# Patient Record
Sex: Male | Born: 1950 | Race: Asian | Hispanic: No | Marital: Married | Smoking: Former smoker
Health system: Southern US, Community
[De-identification: ages and names within clinical notes are randomized; demographics above are authoritative.]

## PROBLEM LIST (undated history)

## (undated) DIAGNOSIS — K5900 Constipation, unspecified: Secondary | ICD-10-CM

## (undated) DIAGNOSIS — K219 Gastro-esophageal reflux disease without esophagitis: Secondary | ICD-10-CM

## (undated) HISTORY — DX: Gastro-esophageal reflux disease without esophagitis: K21.9

## (undated) HISTORY — PX: HAND SURGERY: SHX662

## (undated) HISTORY — DX: Constipation, unspecified: K59.00

---

## 2011-10-29 ENCOUNTER — Ambulatory Visit: Payer: Self-pay | Admitting: Internal Medicine

## 2014-11-10 ENCOUNTER — Telehealth: Payer: Self-pay | Admitting: Family

## 2014-11-10 ENCOUNTER — Ambulatory Visit (INDEPENDENT_AMBULATORY_CARE_PROVIDER_SITE_OTHER): Payer: Medicare Other | Admitting: Family

## 2014-11-10 ENCOUNTER — Other Ambulatory Visit (INDEPENDENT_AMBULATORY_CARE_PROVIDER_SITE_OTHER): Payer: Medicare Other

## 2014-11-10 ENCOUNTER — Encounter: Payer: Self-pay | Admitting: Family

## 2014-11-10 VITALS — BP 118/82 | HR 55 | Temp 97.7°F | Resp 18 | Ht 64.0 in | Wt 138.0 lb

## 2014-11-10 DIAGNOSIS — Z720 Tobacco use: Secondary | ICD-10-CM | POA: Insufficient documentation

## 2014-11-10 DIAGNOSIS — R1013 Epigastric pain: Secondary | ICD-10-CM | POA: Diagnosis not present

## 2014-11-10 DIAGNOSIS — K5900 Constipation, unspecified: Secondary | ICD-10-CM

## 2014-11-10 DIAGNOSIS — K5909 Other constipation: Secondary | ICD-10-CM | POA: Insufficient documentation

## 2014-11-10 LAB — AMYLASE: Amylase: 59 U/L (ref 27–131)

## 2014-11-10 LAB — LIPASE: Lipase: 26 U/L (ref 11.0–59.0)

## 2014-11-10 LAB — H. PYLORI ANTIBODY, IGG: H PYLORI IGG: NEGATIVE

## 2014-11-10 MED ORDER — OMEPRAZOLE 20 MG PO CPDR: 20.0000 mg | DELAYED_RELEASE_CAPSULE | Freq: Every day | ORAL | Status: DC

## 2014-11-10 NOTE — Assessment & Plan Note (Signed)
Current smoker with approximately 20 year pack history. Not currently ready to quit at this time.

## 2014-11-10 NOTE — Patient Instructions (Signed)
Thank you for choosing [facility].  Summary/Instructions:   Please start over the counter colace (docusate sodium) daily for stool softener  May also add Miralax or Senna as needed.   Your prescription(s) have been submitted to your pharmacy or been printed and provided for you. Please take as directed and contact our office if you believe you are having problem(s) with the medication(s) or have any questions.  Please stop by the lab on the basement level of the building for your blood work. Your results will be released to [facility] (or called to you) after review, usually within 72 hours after test completion. If any changes need to be made, you will be notified at that same time.  If your symptoms worsen or fail to improve, please contact our office for further instruction, or in case of emergency go directly to the emergency room at the closest medical facility.    Constipation Constipation is when a person has fewer than three bowel movements a week, has difficulty having a bowel movement, or has stools that are dry, hard, or larger than normal. As people grow older, constipation is more common. If you try to fix constipation with medicines that make you have a bowel movement (laxatives), the problem may get worse. Long-term laxative use may cause the muscles of the colon to become weak. A low-fiber diet, not taking in enough fluids, and taking certain medicines may make constipation worse.  CAUSES   Certain medicines, such as antidepressants, pain medicine, iron supplements, antacids, and water pills.   Certain diseases, such as diabetes, irritable bowel syndrome (IBS), thyroid disease, or depression.   Not drinking enough water.   Not eating enough fiber-rich foods.   Stress or travel.   Lack of physical activity or exercise.   Ignoring the urge to have a bowel movement.   Using laxatives too much.  SIGNS AND SYMPTOMS   Having fewer than three bowel movements  a week.   Straining to have a bowel movement.   Having stools that are hard, dry, or larger than normal.   Feeling full or bloated.   Pain in the lower abdomen.   Not feeling relief after having a bowel movement.  DIAGNOSIS  Your health care provider will take a medical history and perform a physical exam. Further testing may be done for severe constipation. Some tests may include:  A barium enema X-ray to examine your rectum, colon, and, sometimes, your small intestine.   A sigmoidoscopy to examine your lower colon.   A colonoscopy to examine your entire colon. TREATMENT  Treatment will depend on the severity of your constipation and what is causing it. Some dietary treatments include drinking more fluids and eating more fiber-rich foods. Lifestyle treatments may include regular exercise. If these diet and lifestyle recommendations do not help, your health care provider may recommend taking over-the-counter laxative medicines to help you have bowel movements. Prescription medicines may be prescribed if over-the-counter medicines do not work.  HOME CARE INSTRUCTIONS   Eat foods that have a lot of fiber, such as fruits, vegetables, whole grains, and beans.  Limit foods high in fat and processed sugars, such as french fries, hamburgers, cookies, candies, and soda.   A fiber supplement may be added to your diet if you cannot get enough fiber from foods.   Drink enough fluids to keep your urine clear or pale yellow.   Exercise regularly or as directed by your health care provider.   Go to the restroom  when you have the urge to go. Do not hold it.   Only take over-the-counter or prescription medicines as directed by your health care provider. Do not take other medicines for constipation without talking to your health care provider first.  [place] IF:   You have bright red blood in your stool.   Your constipation lasts for more than 4 days or gets  worse.   You have abdominal or rectal pain.   You have thin, pencil-like stools.   You have unexplained weight loss. MAKE SURE YOU:   Understand these instructions.  Will watch your condition.  Will get help right away if you are not doing well or get worse. Document Released: 02/01/2004 Document Revised: 05/10/2013 Document Reviewed: 02/14/2013 [facility] Patient Information 2015 [place], [place]. This information is not intended to replace advice given to you by your health care provider. Make sure you discuss any questions you have with your health care provider.  Gastroesophageal Reflux Disease, Adult Gastroesophageal reflux disease (GERD) happens when acid from your stomach flows up into the esophagus. When acid comes in contact with the esophagus, the acid causes soreness (inflammation) in the esophagus. Over time, GERD may create small holes (ulcers) in the lining of the esophagus. CAUSES   Increased body weight. This puts pressure on the stomach, making acid rise from the stomach into the esophagus.  Smoking. This increases acid production in the stomach.  Drinking alcohol. This causes decreased pressure in the lower esophageal sphincter (valve or ring of muscle between the esophagus and stomach), allowing acid from the stomach into the esophagus.  Late evening meals and a full stomach. This increases pressure and acid production in the stomach.  A malformed lower esophageal sphincter. Sometimes, no cause is found. SYMPTOMS   Burning pain in the lower part of the mid-chest behind the breastbone and in the mid-stomach area. This may occur twice a week or more often.  Trouble swallowing.  Sore throat.  Dry cough.  Asthma-like symptoms including chest tightness, shortness of breath, or wheezing. DIAGNOSIS  Your caregiver may be able to diagnose GERD based on your symptoms. In some cases, X-rays and other tests may be done to check for complications or to check the  condition of your stomach and esophagus. TREATMENT  Your caregiver may recommend over-the-counter or prescription medicines to help decrease acid production. Ask your caregiver before starting or adding any new medicines.  HOME CARE INSTRUCTIONS   Change the factors that you can control. Ask your caregiver for guidance concerning weight loss, quitting smoking, and alcohol consumption.  Avoid foods and drinks that make your symptoms worse, such as:  Caffeine or alcoholic drinks.  Chocolate.  Peppermint or mint flavorings.  Garlic and onions.  Spicy foods.  Citrus fruits, such as oranges, lemons, or limes.  Tomato-based foods such as sauce, chili, salsa, and pizza.  Fried and fatty foods.  Avoid lying down for the 3 hours prior to your bedtime or prior to taking a nap.  Eat small, frequent meals instead of large meals.  Wear loose-fitting clothing. Do not wear anything tight around your waist that causes pressure on your stomach.  Raise the head of your bed 6 to 8 inches with wood blocks to help you sleep. Extra pillows will not help.  Only take over-the-counter or prescription medicines for pain, discomfort, or fever as directed by your caregiver.  Do not take aspirin, ibuprofen, or other nonsteroidal anti-inflammatory drugs (NSAIDs). SEEK IMMEDIATE MEDICAL CARE IF:  You have pain in your arms, neck, jaw, teeth, or back.  Your pain increases or changes in intensity or duration.  You develop nausea, vomiting, or sweating (diaphoresis).  You develop shortness of breath, or you faint.  Your vomit is green, yellow, black, or looks like coffee grounds or blood.  Your stool is red, bloody, or black. These symptoms could be signs of other problems, such as heart disease, gastric bleeding, or esophageal bleeding. MAKE SURE YOU:   Understand these instructions.  Will watch your condition.  Will get help right away if you are not doing well or get worse. Document  Released: 02/12/2005 Document Revised: 07/28/2011 Document Reviewed: 11/22/2010 [facility] Patient Information 2015 [place], [place]. This information is not intended to replace advice given to you by your health care provider. Make sure you discuss any questions you have with your health care provider.

## 2014-11-10 NOTE — Assessment & Plan Note (Signed)
Symptoms and exam consistent with dyspepsia, however cannot rule out GERD or peptic ulcer disease. Start omeprazole. Follow-up if symptoms worsen or fail to improve.

## 2014-11-10 NOTE — Progress Notes (Signed)
Subjective:    Patient ID: Brendan Alexander, male    DOB: [birth year], 64 y.o.   MRN: [id number]  Chief Complaint  Patient presents with  . Establish Care    says he has been having stomach issues, constipation and burning feeling in his stomach after he eats, x3 years     HPI:  Brendan Alexander is a 64 y.o. male with no significant medical history. who presents today for an office visit to establish care.   1.) Associated symptom of pain located in his abdomen has been going on for about 3 years. Describes the pain as burning and is effected by food. Notes that it feels worse within about an hour after eating. Modifying factors include Zantac, Excedrin, or tylenol which has helped a little.  2.) Constipation - associated symptom of constipation has been occuring for several years. Notes that he can go 3-4 days between bowel movements. Does describe straining at times to have a bowel.   3.) Tobacco Use -  Currently smokes approximately one half pack per day and has been smoking for 40 years indicating a 20-pack-year history. Not currently ready to quit smoking.  No Known Allergies   No outpatient prescriptions prior to visit.   No facility-administered medications prior to visit.     History reviewed. No pertinent past medical history.   History reviewed. No pertinent past surgical history.   Family History  Problem Relation Age of Onset  . Family history unknown: Yes     History   Social History  . Marital Status: Married    Spouse Name: N/A  . Number of Children: 4  . Years of Education: 10   Occupational History  . Not on file.   Social History Main Topics  . Smoking status: Current Every Day Smoker -- 0.50 packs/day for 45 years    Types: Cigarettes  . Smokeless tobacco: Never Used  . Alcohol Use: Not on file     Comment: socially  . Drug Use: No  . Sexual Activity: Not on file   Other Topics Concern  . Not on file   Social History Narrative   Fun: Read,  garden   Denies religious beliefs effecting health care.     Review of Systems  Constitutional: Negative for fever and chills.  Respiratory: Negative for cough, chest tightness and shortness of breath.   Cardiovascular: Negative for chest pain, palpitations and leg swelling.  Gastrointestinal: Positive for abdominal pain and constipation. Negative for nausea, vomiting and diarrhea.      Objective:    BP 118/82 mmHg  Pulse 55  Temp(Src) 97.7 F (36.5 C) (Oral)  Resp 18  Ht 5\' 4"  (1.626 m)  Wt 138 lb (62.596 kg)  BMI 23.68 kg/m2  SpO2 97% Nursing note and vital signs reviewed.  Physical Exam  Constitutional: He is oriented to person, place, and time. He appears well-developed and well-nourished. No distress.  Cardiovascular: Normal rate, regular rhythm, normal heart sounds and intact distal pulses.   Pulmonary/Chest: Effort normal and breath sounds normal.  Abdominal: Soft. Normal appearance and bowel sounds are normal. There is tenderness in the suprapubic area, left upper quadrant and left lower quadrant. There is no rigidity, no rebound, no guarding, no CVA tenderness, no tenderness at McBurney's point and negative Murphy's sign.  Neurological: He is alert and oriented to person, place, and time.  Skin: Skin is warm and dry.  Psychiatric: He has a normal mood and affect. His behavior is normal. Judgment  and thought content normal.       Assessment & Plan:   Problem List Items Addressed This Visit      Digestive   Constipation    Lower abdominal discomfort consistent with constipation. Start Colace daily. Add MiraLAX or senna as needed for additional relief. Avoid straining. Increase fluids and fiber intake. Follow-up if symptoms worsen or fail to improve.        Other   Dyspepsia - Primary    Symptoms and exam consistent with dyspepsia, however cannot rule out GERD or peptic ulcer disease. Start omeprazole. Follow-up if symptoms worsen or fail to improve.       Relevant Medications   omeprazole (PRILOSEC) 20 MG capsule   Other Relevant Orders   Lipase (Completed)   Amylase (Completed)   H. pylori antibody, IgG (Completed)   Tobacco use    Current smoker with approximately 20 year pack history. Not currently ready to quit at this time.

## 2014-11-10 NOTE — Progress Notes (Signed)
Pre visit review using our clinic review tool, if applicable. No additional management support is needed unless otherwise documented below in the visit note. 

## 2014-11-10 NOTE — Telephone Encounter (Signed)
Please inform patient that his lab work is negative for H. Pylori or bacterial ulcer and to continue with the current treatment regimen.

## 2014-11-10 NOTE — Assessment & Plan Note (Signed)
Lower abdominal discomfort consistent with constipation. Start Colace daily. Add MiraLAX or senna as needed for additional relief. Avoid straining. Increase fluids and fiber intake. Follow-up if symptoms worsen or fail to improve.

## 2014-11-14 NOTE — Telephone Encounter (Signed)
Tried to call patient to advise of greg calone's note, no answer, no way to leave message

## 2019-01-19 ENCOUNTER — Other Ambulatory Visit: Payer: Self-pay

## 2019-01-19 ENCOUNTER — Ambulatory Visit (INDEPENDENT_AMBULATORY_CARE_PROVIDER_SITE_OTHER): Payer: Medicare Other | Admitting: Family Medicine

## 2019-01-19 ENCOUNTER — Encounter: Payer: Self-pay | Admitting: Family Medicine

## 2019-01-19 VITALS — BP 118/78 | HR 58 | Temp 96.5°F | Ht 63.0 in | Wt 141.0 lb

## 2019-01-19 DIAGNOSIS — Z0000 Encounter for general adult medical examination without abnormal findings: Secondary | ICD-10-CM

## 2019-01-19 DIAGNOSIS — K5900 Constipation, unspecified: Secondary | ICD-10-CM

## 2019-01-19 DIAGNOSIS — F17210 Nicotine dependence, cigarettes, uncomplicated: Secondary | ICD-10-CM | POA: Diagnosis not present

## 2019-01-19 DIAGNOSIS — Z1211 Encounter for screening for malignant neoplasm of colon: Secondary | ICD-10-CM | POA: Diagnosis not present

## 2019-01-19 DIAGNOSIS — Z13220 Encounter for screening for lipoid disorders: Secondary | ICD-10-CM | POA: Diagnosis not present

## 2019-01-19 NOTE — Patient Instructions (Signed)
Constipation, Adult Constipation is when a person has fewer bowel movements in a week than normal, has difficulty having a bowel movement, or has stools that are dry, hard, or larger than normal. Constipation may be caused by an underlying condition. It may become worse with age if a person takes certain medicines and does not take in enough fluids. Follow these instructions at home: Eating and drinking   Eat foods that have a lot of fiber, such as fresh fruits and vegetables, whole grains, and beans.  Limit foods that are high in fat, low in fiber, or overly processed, such as french fries, hamburgers, cookies, candies, and soda.  Drink enough fluid to keep your urine clear or pale yellow. General instructions  Exercise regularly or as told by your health care provider.  Go to the restroom when you have the urge to go. Do not hold it in.  Take over-the-counter and prescription medicines only as told by your health care provider. These include any fiber supplements.  Practice pelvic floor retraining exercises, such as deep breathing while relaxing the lower abdomen and pelvic floor relaxation during bowel movements.  Watch your condition for any changes.  Keep all follow-up visits as told by your health care provider. This is important. Contact a health care provider if:  You have pain that gets worse.  You have a fever.  You do not have a bowel movement after 4 days.  You vomit.  You are not hungry.  You lose weight.  You are bleeding from the anus.  You have thin, pencil-like stools. Get help right away if:  You have a fever and your symptoms suddenly get worse.  You leak stool or have blood in your stool.  Your abdomen is bloated.  You have severe pain in your abdomen.  You feel dizzy or you faint. This information is not intended to replace advice given to you by your health care provider. Make sure you discuss any questions you have with your health care  provider. Document Released: 02/01/2004 Document Revised: 04/17/2017 Document Reviewed: 10/24/2015 Elsevier Patient Education  2020 [place] Screening  Colorectal cancer screening is a group of tests that are used to check for colorectal cancer before symptoms develop. Colorectal refers to the colon and rectum. The colon and rectum are located at the end of the digestive tract and carry bowel movements out of the body. Who should have screening? All adults starting at age 19 until age 93 should have screening. Your health care provider may recommend screening at age 14. You will have tests every 1-10 years, depending on your results and the type of screening test. You may have screening tests starting at an earlier age, or more frequently than other people, if you have any of the following risk factors:  A personal or family history of colorectal cancer or abnormal growths (polyps).  Inflammatory bowel disease, such as ulcerative colitis or Crohn's disease.  A history of having radiation treatment to the abdomen or pelvic area for cancer.  Colorectal cancer symptoms, such as changes in bowel habits or blood in your stool.  A type of colon cancer syndrome that is passed from parent to child (hereditary), such as: ? Lynch syndrome. ? Familial adenomatous polyposis. ? Turcot syndrome. ? Peutz-Jeghers syndrome. Screening recommendations for adults who are 29-29 years old vary depending on health. How is screening done? There are several types of colorectal screening tests. You may have one or more of the following:  Guaiac-based fecal occult blood testing. For this test, a stool (feces) sample is checked for hidden (occult) blood, which could be a sign of colorectal cancer.  Fecal immunochemical test (FIT). For this test, a stool sample is checked for blood, which could be a sign of colorectal cancer.  Stool DNA test. For this test, a stool sample is checked for  blood and changes in DNA that could lead to colorectal cancer.  Sigmoidoscopy. During this test, a thin, flexible tube with a camera on the end (sigmoidoscope) is used to examine the rectum and the lower colon.  Colonoscopy. During this test, a long, flexible tube with a camera on the end (colonoscope) is used to examine the entire colon and rectum. With a colonoscopy, it is possible to take a sample of tissue (biopsy) and remove small polyps during the test.  Virtual colonoscopy. Instead of a colonoscope, this type of colonoscopy uses X-rays (CT scan) and computers to produce images of the colon and rectum. What are the benefits of screening? Screening reduces your risk for colorectal cancer and can help identify cancer at an early stage, when the cancer can be removed or treated more easily. It is common for polyps to form in the lining of the colon, especially as you age. These polyps may be cancerous or become cancerous over time. Screening can identify these polyps. What are the risks of screening? Each screening test may have different risks.  Stool sample tests have fewer risks than other types of screening tests. However, you may need more tests to confirm results from a stool sample test.  Screening tests that involve X-rays expose you to low levels of radiation, which may slightly increase your cancer risk. The benefit of detecting cancer outweighs the slight increase in risk.  Screening tests such as sigmoidoscopy and colonoscopy may place you at risk for bleeding, intestinal damage, infection, or a reaction to medicines given during the exam. Talk with your health care provider to understand your risk for colorectal cancer and to make a screening plan that is right for you. Questions to ask your health care provider  When should I start colorectal cancer screening?  What is my risk for colorectal cancer?  How often do I need screening?  Which screening tests do I need?  How do  I get my test results?  What do my results mean? Where to find more information Learn more about colorectal cancer screening from:  [employer]: www.cancer.org  [employer]: www.cancer.gov Summary  Colorectal cancer screening is a group of tests used to check for colorectal cancer before symptoms develop.  Screening reduces your risk for colorectal cancer and can help identify cancer at an early stage, when the cancer can be removed or treated more easily.  All adults starting at age 39 until age 23 should have screening. Your health care provider may recommend screening at age 47.  You may have screening tests starting at an earlier age, or more frequently than other people, if you have certain risk factors.  Talk with your health care provider to understand your risk for colorectal cancer and to make a screening plan that is right for you. This information is not intended to replace advice given to you by your health care provider. Make sure you discuss any questions you have with your health care provider. Document Released: 10/23/2009 Document Revised: 08/25/2018 Document Reviewed: 02/04/2017 Elsevier Patient Education  2020 [employer].

## 2019-01-19 NOTE — Progress Notes (Signed)
Patient presents to clinic today to establish care and f/u on chronic issues.    Pt's first name is pronounced "Noonqee-g".    Pt's preferred language is [place].  Interpreter not available during visit.  Pt's son came to appt however was unable to accompany his father 2/2 having a cough (current office policy 2/2 [id number] pandemic).  SUBJECTIVE: PMH: Pt is a 68 yo male with pmh sig for nicotine use.  Pt states he has not been seen in a while.  Constipation: -may have a BM q 14 days. -endorses bloating -denies abdominal pain -has not taken anything.  Nicotine use: -smoking ~1/2 pk/day -states does not breath in the smoke.  Allergies: NKDA  Past Surg Hx: none   History reviewed. No pertinent past medical history.  History reviewed. No pertinent surgical history.  No current outpatient medications on file prior to visit.   No current facility-administered medications on file prior to visit.     No Known Allergies  Family History  Family history unknown: Yes    Social History   Socioeconomic History  . Marital status: Married    Spouse name: Not on file  . Number of children: 4  . Years of education: 10  . Highest education level: Not on file  Occupational History  . Not on file  Social Needs  . Financial resource strain: Not on file  . Food insecurity    Worry: Not on file    Inability: Not on file  . Transportation needs    Medical: Not on file    Non-medical: Not on file  Tobacco Use  . Smoking status: Current Every Day Smoker    Packs/day: 0.50    Years: 45.00    Pack years: 22.50    Types: Cigarettes  . Smokeless tobacco: Never Used  Substance and Sexual Activity  . Alcohol use: Yes    Alcohol/week: 0.0 standard drinks    Comment: socially  . Drug use: No  . Sexual activity: Not on file  Lifestyle  . Physical activity    Days per week: Not on file    Minutes per session: Not on file  . Stress: Not on file  Relationships  . Social  Herbalist on phone: Not on file    Gets together: Not on file    Attends religious service: Not on file    Active member of club or organization: Not on file    Attends meetings of clubs or organizations: Not on file    Relationship status: Not on file  . Intimate partner violence    Fear of current or ex partner: Not on file    Emotionally abused: Not on file    Physically abused: Not on file    Forced sexual activity: Not on file  Other Topics Concern  . Not on file  Social History Narrative   Fun: Read, garden   Denies religious beliefs effecting health care.     ROS General: Denies fever, chills, night sweats, changes in weight, changes in appetite HEENT: Denies headaches, ear pain, changes in vision, rhinorrhea, sore throat CV: Denies CP, palpitations, SOB, orthopnea Pulm: Denies SOB, cough, wheezing GI: Denies abdominal pain, nausea, vomiting, diarrhea  +constipation GU: Denies dysuria, hematuria, frequency, vaginal discharge Msk: Denies muscle cramps, joint pains Neuro: Denies weakness, numbness, tingling Skin: Denies rashes, bruising Psych: Denies depression, anxiety, hallucinations  BP 118/78 (BP Location: Left Arm, Patient Position: Sitting, Cuff Size: Normal)  Pulse (!) 58   Temp (!) 96.5 F (35.8 C) (Temporal)   Ht 5\' 3"  (1.6 m)   Wt 141 lb (64 kg)   SpO2 98%   BMI 24.98 kg/m   Physical Exam Gen. Pleasant, well developed, well-nourished, in NAD HEENT - [place]/AT, PERRL, conjunctive clear, no scleral icterus, no nasal drainage, pharynx without erythema or exudate. Neck: No JVD, no thyromegaly, no carotid bruits Lungs: no use of accessory muscles, CTAB, no wheezes, rales or rhonchi Cardiovascular: RRR, No r/g/m, no peripheral edema Abdomen: BS present, soft, nontender,nondistended Musculoskeletal: No deformities, moves all four extremities, no cyanosis or clubbing, normal tone Neuro:  A&Ox3, CN II-XII intact, normal gait Skin:  Warm, dry, intact, no  lesions  Recent Results (from the past 2160 hour(s))  Comprehensive metabolic panel     Status: None   Collection Time: 01/19/19  5:01 PM  Result Value Ref Range   Sodium 140 135 - 145 mEq/L   Potassium 4.3 3.5 - 5.1 mEq/L   Chloride 105 96 - 112 mEq/L   CO2 29 19 - 32 mEq/L   Glucose, Bld 77 70 - 99 mg/dL   BUN 20 6 - 23 mg/dL   Creatinine, Ser 1.07 0.40 - 1.50 mg/dL   Total Bilirubin 0.6 0.2 - 1.2 mg/dL   Alkaline Phosphatase 66 39 - 117 U/L   AST 27 0 - 37 U/L   ALT 25 0 - 53 U/L   Total Protein 7.3 6.0 - 8.3 g/dL   Albumin 4.2 3.5 - 5.2 g/dL   Calcium 9.6 8.4 - 10.5 mg/dL   GFR 68.44 >60.00 mL/min  CBC with Differential/Platelet     Status: None   Collection Time: 01/19/19  5:01 PM  Result Value Ref Range   WBC 8.2 4.0 - 10.5 K/uL   RBC 5.00 4.22 - 5.81 Mil/uL   Hemoglobin 14.2 13.0 - 17.0 g/dL   HCT 44.0 39.0 - 52.0 %   MCV 87.9 78.0 - 100.0 fl   MCHC 32.4 30.0 - 36.0 g/dL   RDW 13.7 11.5 - 15.5 %   Platelets 190.0 150.0 - 400.0 K/uL   Neutrophils Relative % 59.1 43.0 - 77.0 %   Lymphocytes Relative 28.4 12.0 - 46.0 %   Monocytes Relative 8.6 3.0 - 12.0 %   Eosinophils Relative 3.1 0.0 - 5.0 %   Basophils Relative 0.8 0.0 - 3.0 %   Neutro Abs 4.8 1.4 - 7.7 K/uL   Lymphs Abs 2.3 0.7 - 4.0 K/uL   Monocytes Absolute 0.7 0.1 - 1.0 K/uL   Eosinophils Absolute 0.3 0.0 - 0.7 K/uL   Basophils Absolute 0.1 0.0 - 0.1 K/uL  Lipid panel     Status: Abnormal   Collection Time: 01/19/19  5:01 PM  Result Value Ref Range   Cholesterol 178 0 - 200 mg/dL    Comment: ATP III Classification       Desirable:  < 200 mg/dL               Borderline High:  200 - 239 mg/dL          High:  > = 240 mg/dL   Triglycerides 62.0 0.0 - 149.0 mg/dL    Comment: Normal:  <150 mg/dLBorderline High:  150 - 199 mg/dL   HDL 54.40 >39.00 mg/dL   VLDL 12.4 0.0 - 40.0 mg/dL   LDL Cholesterol 112 (H) 0 - 99 mg/dL   Total CHOL/HDL Ratio 3     Comment:  Men          Women1/2 Average Risk      3.4          3.3Average Risk          5.0          4.42X Average Risk          9.6          7.13X Average Risk          15.0          11.0                       NonHDL 123.93     Comment: NOTE:  Non-HDL goal should be 30 mg/dL higher than patient's LDL goal (i.e. LDL goal of < 70 mg/dL, would have non-HDL goal of < 100 mg/dL)    Assessment/Plan: Constipation, unspecified constipation type  -discussed lifestyle modifications -consider Miralax prn -given handout - Plan: Comprehensive metabolic panel, CBC with Differential/Platelet  Cigarette nicotine dependence without complication -smoking cessation counseling >3 min, <10 min -pt encouraged to cut down -will re-evaluate at each OFV  Screen for colon cancer  - Plan: Ambulatory referral to Gastroenterology  Screening for cholesterol level  - Plan: Lipid panel   F/u prn.  Interpreter services needed for future visits  Grier Mitts, MD

## 2019-01-20 LAB — COMPREHENSIVE METABOLIC PANEL
ALT: 25 U/L (ref 0–53)
AST: 27 U/L (ref 0–37)
Albumin: 4.2 g/dL (ref 3.5–5.2)
Alkaline Phosphatase: 66 U/L (ref 39–117)
BUN: 20 mg/dL (ref 6–23)
CO2: 29 mEq/L (ref 19–32)
Calcium: 9.6 mg/dL (ref 8.4–10.5)
Chloride: 105 mEq/L (ref 96–112)
Creatinine, Ser: 1.07 mg/dL (ref 0.40–1.50)
GFR: 68.44 mL/min (ref >60.00)
Glucose, Bld: 77 mg/dL (ref 70–99)
Potassium: 4.3 mEq/L (ref 3.5–5.1)
Sodium: 140 mEq/L (ref 135–145)
Total Bilirubin: 0.6 mg/dL (ref 0.2–1.2)
Total Protein: 7.3 g/dL (ref 6.0–8.3)

## 2019-01-20 LAB — CBC WITH DIFFERENTIAL/PLATELET
Basophils Absolute: 0.1 10*3/uL (ref 0.0–0.1)
Basophils Relative: 0.8 % (ref 0.0–3.0)
Eosinophils Absolute: 0.3 10*3/uL (ref 0.0–0.7)
Eosinophils Relative: 3.1 % (ref 0.0–5.0)
HCT: 44 % (ref 39.0–52.0)
Hemoglobin: 14.2 g/dL (ref 13.0–17.0)
Lymphocytes Relative: 28.4 % (ref 12.0–46.0)
Lymphs Abs: 2.3 10*3/uL (ref 0.7–4.0)
MCHC: 32.4 g/dL (ref 30.0–36.0)
MCV: 87.9 fl (ref 78.0–100.0)
Monocytes Absolute: 0.7 10*3/uL (ref 0.1–1.0)
Monocytes Relative: 8.6 % (ref 3.0–12.0)
Neutro Abs: 4.8 10*3/uL (ref 1.4–7.7)
Neutrophils Relative %: 59.1 % (ref 43.0–77.0)
Platelets: 190 10*3/uL (ref 150.0–400.0)
RBC: 5 Mil/uL (ref 4.22–5.81)
RDW: 13.7 % (ref 11.5–15.5)
WBC: 8.2 10*3/uL (ref 4.0–10.5)

## 2019-01-20 LAB — LIPID PANEL
Cholesterol: 178 mg/dL (ref 0–200)
HDL: 54.4 mg/dL (ref >39.00)
LDL Cholesterol: 112 mg/dL — ABNORMAL HIGH (ref 0–99)
NonHDL: 123.93
Total CHOL/HDL Ratio: 3
Triglycerides: 62 mg/dL (ref 0.0–149.0)
VLDL: 12.4 mg/dL (ref 0.0–40.0)

## 2019-01-21 ENCOUNTER — Encounter: Payer: Self-pay | Admitting: Family Medicine

## 2019-01-26 ENCOUNTER — Encounter: Payer: Self-pay | Admitting: Gastroenterology

## 2019-02-14 ENCOUNTER — Telehealth: Payer: Self-pay | Admitting: *Deleted

## 2019-02-14 NOTE — Telephone Encounter (Signed)
Patient did not show up for his PV appointment. Patient was called by the interpreter at 1:10, no answer. I just called the patient at 1:45 and no answer, unable to leave a message because the phone states the voice mail has not been set up yet. PV and colonoscopy cancelled and no show letter mailed to the patient.

## 2019-02-28 ENCOUNTER — Other Ambulatory Visit: Payer: Self-pay

## 2019-02-28 ENCOUNTER — Ambulatory Visit (INDEPENDENT_AMBULATORY_CARE_PROVIDER_SITE_OTHER): Payer: Medicare Other | Admitting: Family Medicine

## 2019-02-28 ENCOUNTER — Encounter: Payer: Medicare Other | Admitting: Gastroenterology

## 2019-02-28 ENCOUNTER — Encounter: Payer: Self-pay | Admitting: Family Medicine

## 2019-02-28 VITALS — BP 116/72 | HR 61 | Temp 97.9°F | Resp 16 | Ht 63.0 in | Wt 143.6 lb

## 2019-02-28 DIAGNOSIS — K5900 Constipation, unspecified: Secondary | ICD-10-CM

## 2019-02-28 NOTE — Progress Notes (Signed)
  Subjective:     Patient ID: Brendan Alexander, male   DOB: 1950-11-28, 68 y.o.   MRN: [id number]  HPI   Patient is seen with the assistance of an interpreter.  He apparently had some intermittent constipation especially over the past week.  Sometimes goes up to a full week without bowel movement.  He has not any appetite or weight changes.  No abdominal pain.  Has never had colonoscopy.  No bloody stools.  Usually has smaller caliber stools when he does go.  He admits to not drinking much fluid.  Does not take any regular medications.  No anticholinergics.  He had recent lipid panel, CBC, and comprehensive chemistries which were unremarkable. He has not tried any stool softeners or other remedies.  No past medical history on file. No past surgical history on file.  reports that he has been smoking cigarettes. He has a 22.50 pack-year smoking history. He has never used smokeless tobacco. He reports current alcohol use. He reports that he does not use drugs. Family history is unknown by patient. No Known Allergies   Review of Systems  Constitutional: Negative for appetite change, chills, fever and unexpected weight change.  Respiratory: Negative for cough and shortness of breath.   Cardiovascular: Negative for chest pain.  Gastrointestinal: Positive for constipation. Negative for abdominal pain, diarrhea, nausea and vomiting.       Objective:   Physical Exam Constitutional:      Appearance: He is well-developed.  Cardiovascular:     Rate and Rhythm: Normal rate and regular rhythm.  Pulmonary:     Effort: Pulmonary effort is normal.     Breath sounds: Normal breath sounds.  Abdominal:     General: Abdomen is flat. Bowel sounds are normal.     Palpations: Abdomen is soft. There is no mass.     Tenderness: There is no abdominal tenderness.  Genitourinary:    Comments: Rectal exam reveals no mass.  No impaction.  Minimal stool in rectal vault. Neurological:     Mental Status: He is  alert.        Assessment:     Constipation.  No evidence for impaction.  We discussed the following    Plan:     -Increase fluid consumption -Recommend fiber of at least 25 to 30 g daily -Consider stool softener such as Colace -Could consider MiraLAX if the above is not working -Check TSH to rule out hypothyroidism -Avoid any anticholinergic medications  Brendan Post MD [facility]

## 2019-02-28 NOTE — Patient Instructions (Signed)
Constipation, Adult Constipation is when a person has fewer bowel movements in a week than normal, has difficulty having a bowel movement, or has stools that are dry, hard, or larger than normal. Constipation may be caused by an underlying condition. It may become worse with age if a person takes certain medicines and does not take in enough fluids. Follow these instructions at home: Eating and drinking   Eat foods that have a lot of fiber, such as fresh fruits and vegetables, whole grains, and beans.  Limit foods that are high in fat, low in fiber, or overly processed, such as french fries, hamburgers, cookies, candies, and soda.  Drink enough fluid to keep your urine clear or pale yellow. General instructions  Exercise regularly or as told by your health care provider.  Go to the restroom when you have the urge to go. Do not hold it in.  Take over-the-counter and prescription medicines only as told by your health care provider. These include any fiber supplements.  Practice pelvic floor retraining exercises, such as deep breathing while relaxing the lower abdomen and pelvic floor relaxation during bowel movements.  Watch your condition for any changes.  Keep all follow-up visits as told by your health care provider. This is important. Contact a health care provider if:  You have pain that gets worse.  You have a fever.  You do not have a bowel movement after 4 days.  You vomit.  You are not hungry.  You lose weight.  You are bleeding from the anus.  You have thin, pencil-like stools. Get help right away if:  You have a fever and your symptoms suddenly get worse.  You leak stool or have blood in your stool.  Your abdomen is bloated.  You have severe pain in your abdomen.  You feel dizzy or you faint. This information is not intended to replace advice given to you by your health care provider. Make sure you discuss any questions you have with your health care  provider. Document Released: 02/01/2004 Document Revised: 04/17/2017 Document Reviewed: 10/24/2015 Elsevier Patient Education  2020 [employer].  If increased fluids and fiber don't relieve constipation, would try Miralax or stool softeners which are over the counter.

## 2019-03-01 LAB — TSH: TSH: 0.84 u[IU]/mL (ref 0.35–4.50)

## 2019-04-18 ENCOUNTER — Telehealth: Payer: Medicare Other | Admitting: Family Medicine

## 2019-04-18 ENCOUNTER — Telehealth: Payer: Self-pay | Admitting: Family Medicine

## 2019-04-18 ENCOUNTER — Encounter: Payer: Self-pay | Admitting: Gastroenterology

## 2019-04-18 DIAGNOSIS — K219 Gastro-esophageal reflux disease without esophagitis: Secondary | ICD-10-CM

## 2019-04-18 DIAGNOSIS — K5900 Constipation, unspecified: Secondary | ICD-10-CM

## 2019-04-18 DIAGNOSIS — F17210 Nicotine dependence, cigarettes, uncomplicated: Secondary | ICD-10-CM

## 2019-04-18 DIAGNOSIS — F5101 Primary insomnia: Secondary | ICD-10-CM

## 2019-04-18 MED ORDER — PANTOPRAZOLE SODIUM 20 MG PO TBEC: 20.0000 mg | DELAYED_RELEASE_TABLET | Freq: Every day | ORAL | 3 refills | Status: DC

## 2019-04-18 NOTE — Telephone Encounter (Signed)
The patients son called in wanting an appointment for his father because he is constipated/nausea/fatigue/hard time sleeping  I was trying to schedule a virtual appointment because of the symptoms and the son was wanting him to come in office because the interpreter normally brings him in. I suggested that the interpreter can call the patient and then three way the provider for the virtual visit. The son said not to worry about it that this is too much trouble and he hung up.

## 2019-04-18 NOTE — Progress Notes (Signed)
Virtual Visit via Video Note  I connected with [name] (pt's son)  on 04/18/19 at  2:00 PM EST by a video enabled telemedicine application 2/2 [id number] pandemic and verified that I am speaking with the correct person using two identifiers.  Location patient: home Location provider:work or home office Persons participating in the virtual visit: provider, pt's son [name].  I discussed the limitations of evaluation and management by telemedicine and the availability of in person appointments. The patient expressed understanding and agreed to proceed.   HPI: Pt's son, Brendan Alexander,  is on the call.  Pt (Brendan Alexander) went to the store per his son.  Pt requires a [place] interpreter.  Per son, pt has gas and acid reflux.  Pt eats twice a day around 11 am and then at 5 pm.  Tried OTC prilosec without relief.  Pt eating greasy foods and "regular foods".  Does not eat spicy food, oj, or tomatoes.  A referral for colonoscopy was placed, however son states they never got a call regarding appt details.  Pt still smoking cigarettes.  1 ppd x 40 + yrs.  Pt also having difficulty sleeping.  Pt snores at night.  Pt drinking 2 cups of coffee in the am.  Does not drink soda or tea.  Tosses and turns all night per son.  Nyquil worked for a few nights then stopped working.   ROS: See pertinent positives and negatives per HPI.  No past medical history on file.  No past surgical history on file.  Family History  Family history unknown: Yes     No current outpatient medications on file.  EXAM:  VITALS per patient if applicable: Unable to assess as pt was not present during video visit.   ASSESSMENT AND PLAN:  Discussed the following assessment and plan:  Gastroesophageal reflux disease, unspecified whether esophagitis present  -avoid foods known to cause problems -keep a food diary - Plan: pantoprazole (PROTONIX) 20 MG tablet  Constipation, unspecified constipation type -discussed  consistent Miralax use.  Can use BID and wean prn -increase fiber intake and water intake -given phone number to GI clinic.  Advised to contact to reschedule appt.  Primary insomnia -discussed sleep hygiene -try OTC Melatonin 3 mg  Cigarette nicotine dependence without complication -discussed need for smoking cessation  F/u prn when pt is present and interpreter available.   I discussed the assessment and treatment plan with the patient. The patient was provided an opportunity to ask questions and all were answered. The patient agreed with the plan and demonstrated an understanding of the instructions.   The patient was advised to call back or seek an in-person evaluation if the symptoms worsen or if the condition fails to improve as anticipated.  Billie Ruddy, MD

## 2019-04-22 NOTE — Telephone Encounter (Signed)
Pt had a virtual visit with Dr Volanda Napoleon on 04/18/2019

## 2019-04-27 ENCOUNTER — Telehealth: Payer: Self-pay | Admitting: *Deleted

## 2019-04-27 NOTE — Telephone Encounter (Signed)
Yes, trilyte is fine, thanks

## 2019-04-27 NOTE — Telephone Encounter (Signed)
Dr Loletha Carrow,  This pt is scheduled to see PV 12-21 and has a colon scheduled for 05-23-2019 Monday .  The Appointment notes state pt needs A 2 DAY Golytely prep-  He has seen his PCP several times since October for Chronic Constipation.  Golytely is still on back order however [facility] said they have Trilyte.  Can we use Trilyte for him?  Thanks for your time, Lelan Pons

## 2019-05-09 ENCOUNTER — Other Ambulatory Visit: Payer: Self-pay

## 2019-05-09 ENCOUNTER — Ambulatory Visit (AMBULATORY_SURGERY_CENTER): Payer: Medicare Other | Admitting: *Deleted

## 2019-05-09 VITALS — Temp 97.5°F | Ht 63.0 in | Wt 144.8 lb

## 2019-05-09 DIAGNOSIS — Z1211 Encounter for screening for malignant neoplasm of colon: Secondary | ICD-10-CM

## 2019-05-09 DIAGNOSIS — Z1159 Encounter for screening for other viral diseases: Secondary | ICD-10-CM

## 2019-05-09 MED ORDER — PEG 3350-KCL-NA BICARB-NACL 420 G PO SOLR: 4000.0000 mL | Freq: Once | ORAL | Status: DC

## 2019-05-09 MED ORDER — PEG 3350-KCL-NA BICARB-NACL 420 G PO SOLR: 4000.0000 mL | Freq: Once | ORAL | 0 refills | Status: AC

## 2019-05-09 NOTE — Progress Notes (Signed)
Interpreter [place] n PV today with pt -temp 96.8 PT  STATES HIS DAUGHTER READS/ SPEAKS ENGLISH  IN HIS HOME - she can help with instructions   No egg or soy allergy known to patient  No issues with past sedation with any surgeries  or procedures, no intubation problems  No diet pills per patient No home 02 use per patient  No blood thinners per patient  Pt  issues with constipation - Miralax BId and occ feels hot in stomach up to his chest - he states every 2 days soft stools- improving per pt - per HD 2 day Trilyte prep - prep instructions discussed with pt thru interpreter several times in PV today - asked pt to have daughter or son who reads English to read and help pt with the instructions - he stated he would   No A fib or A flutter  EMMI video sent to pt's e mail   Due to the COVID-19 pandemic we are asking patients to follow these guidelines. Please only bring one care partner. Please be aware that your care partner may wait in the car in the parking lot or if they feel like they will be too hot to wait in the car, they may wait in the lobby on the 4th floor. All care partners are required to wear a mask the entire time (we do not have any that we can provide them), they need to practice social distancing, and we will do a Covid check for all patient's and care partners when you arrive. Also we will check their temperature and your temperature. If the care partner waits in their car they need to stay in the parking lot the entire time and we will call them on their cell phone when the patient is ready for discharge so they can bring the car to the front of the building. Also all patient's will need to wear a mask into building.

## 2019-05-12 ENCOUNTER — Other Ambulatory Visit: Payer: Medicare Other

## 2019-05-19 ENCOUNTER — Telehealth: Payer: Self-pay

## 2019-05-19 NOTE — Telephone Encounter (Signed)
Called pt to find out if pt showed for COVID test that was scheduled for 05/18/19.  Made call through interpreter line.  No answer.  Unable to leave vm since no vm was set up.  Also called pt's son, Brendan Alexander (ok per DPR), line was busy.  Unable to leave a message.

## 2019-05-23 ENCOUNTER — Ambulatory Visit (INDEPENDENT_AMBULATORY_CARE_PROVIDER_SITE_OTHER): Payer: Medicare Other

## 2019-05-23 ENCOUNTER — Other Ambulatory Visit: Payer: Self-pay | Admitting: Gastroenterology

## 2019-05-23 ENCOUNTER — Encounter: Payer: Medicare Other | Admitting: Gastroenterology

## 2019-05-23 DIAGNOSIS — Z1159 Encounter for screening for other viral diseases: Secondary | ICD-10-CM

## 2019-05-24 LAB — SARS CORONAVIRUS 2 (TAT 6-24 HRS): SARS Coronavirus 2: NEGATIVE

## 2019-05-25 ENCOUNTER — Ambulatory Visit (AMBULATORY_SURGERY_CENTER): Payer: Medicare Other | Admitting: Gastroenterology

## 2019-05-25 ENCOUNTER — Other Ambulatory Visit: Payer: Self-pay

## 2019-05-25 ENCOUNTER — Encounter: Payer: Self-pay | Admitting: Gastroenterology

## 2019-05-25 VITALS — BP 118/55 | HR 59 | Temp 97.8°F | Resp 20 | Ht 63.0 in | Wt 144.8 lb

## 2019-05-25 DIAGNOSIS — Z1211 Encounter for screening for malignant neoplasm of colon: Secondary | ICD-10-CM | POA: Diagnosis not present

## 2019-05-25 DIAGNOSIS — D122 Benign neoplasm of ascending colon: Secondary | ICD-10-CM

## 2019-05-25 DIAGNOSIS — D120 Benign neoplasm of cecum: Secondary | ICD-10-CM

## 2019-05-25 MED ORDER — SODIUM CHLORIDE 0.9 % IV SOLN: 500.0000 mL | Freq: Once | INTRAVENOUS | Status: DC

## 2019-05-25 NOTE — Progress Notes (Signed)
Temp JB VS KA  Pt's states no medical or surgical changes since previsit or office visit.  Interpreter used today at the [facility] for this pt.  Interpreter's name is-Kokkun

## 2019-05-25 NOTE — Patient Instructions (Signed)
   Please see handouts given to yon on Polyps. THank you for letting us take care of your healthcare needs today.  YOU HAD AN ENDOSCOPIC PROCEDURE TODAY AT [facility]:   Refer to the procedure report that was given to you for any specific questions about what was found during the examination.  If the procedure report does not answer your questions, please call your gastroenterologist to clarify.  If you requested that your care partner not be given the details of your procedure findings, then the procedure report has been included in a sealed envelope for you to review at your convenience later.  YOU SHOULD EXPECT: Some feelings of bloating in the abdomen. Passage of more gas than usual.  Walking can help get rid of the air that was put into your GI tract during the procedure and reduce the bloating. If you had a lower endoscopy (such as a colonoscopy or flexible sigmoidoscopy) you may notice spotting of blood in your stool or on the toilet paper. If you underwent a bowel prep for your procedure, you may not have a normal bowel movement for a few days.  Please Note:  You might notice some irritation and congestion in your nose or some drainage.  This is from the oxygen used during your procedure.  There is no need for concern and it should clear up in a day or so.  SYMPTOMS TO REPORT IMMEDIATELY:   Following lower endoscopy (colonoscopy or flexible sigmoidoscopy):  Excessive amounts of blood in the stool  Significant tenderness or worsening of abdominal pains  Swelling of the abdomen that is new, acute  Fever of 100F or higher   For urgent or emergent issues, a gastroenterologist can be reached at any hour by calling [phone].   DIET:  We do recommend a small meal at first, but then you may proceed to your regular diet.  Drink plenty of fluids but you should avoid alcoholic beverages for 24 hours.  ACTIVITY:  You should plan to take it easy for the rest of today and  you should NOT DRIVE or use heavy machinery until tomorrow (because of the sedation medicines used during the test).    FOLLOW UP: Our staff will call the number listed on your records 48-72 hours following your procedure to check on you and address any questions or concerns that you may have regarding the information given to you following your procedure. If we do not reach you, we will leave a message.  We will attempt to reach you two times.  During this call, we will ask if you have developed any symptoms of COVID 19. If you develop any symptoms (ie: fever, flu-like symptoms, shortness of breath, cough etc.) before then, please call [phone].  If you test positive for Covid 19 in the 2 weeks post procedure, please call and report this information to [place].    If any biopsies were taken you will be contacted by phone or by letter within the next 1-3 weeks.  Please call us at [phone] if you have not heard about the biopsies in 3 weeks.    SIGNATURES/CONFIDENTIALITY: You and/or your care partner have signed paperwork which will be entered into your electronic medical record.  These signatures attest to the fact that that the information above on your After Visit Summary has been reviewed and is understood.  Full responsibility of the confidentiality of this discharge information lies with you and/or your care-partner.

## 2019-05-25 NOTE — Progress Notes (Signed)
Called to room to assist during endoscopic procedure.  Patient ID and intended procedure confirmed with present staff. Received instructions for my participation in the procedure from the performing physician.  

## 2019-05-25 NOTE — Op Note (Signed)
[facility] Patient Name: Brendan Alexander Procedure Date: 05/25/2019 9:11 AM MRN: [id number] Endoscopist: Mallie Mussel L. Loletha Carrow , MD Age: 69 Referring MD:  Date of Birth: 04/18/1951 Gender: Male Account #: [id number] Procedure:                Colonoscopy Indications:              Screening for colorectal malignant neoplasm, This                            is the patient's first colonoscopy Medicines:                Monitored Anesthesia Care Procedure:                Pre-Anesthesia Assessment:                           - Prior to the procedure, a History and Physical                            was performed, and patient medications and                            allergies were reviewed. The patient's tolerance of                            previous anesthesia was also reviewed. The risks                            and benefits of the procedure and the sedation                            options and risks were discussed with the patient.                            All questions were answered, and informed consent                            was obtained. Prior Anticoagulants: The patient has                            taken no previous anticoagulant or antiplatelet                            agents. ASA Grade Assessment: II - A patient with                            mild systemic disease. After reviewing the risks                            and benefits, the patient was deemed in                            satisfactory condition to undergo the procedure.  After obtaining informed consent, the colonoscope                            was passed under direct vision. Throughout the                            procedure, the patient's blood pressure, pulse, and                            oxygen saturations were monitored continuously. The                            Colonoscope was introduced through the anus and                            advanced to the the terminal  ileum, with                            identification of the appendiceal orifice and IC                            valve. The colonoscopy was performed without                            difficulty. The patient tolerated the procedure                            well. The quality of the bowel preparation was                            good. The terminal ileum, ileocecal valve,                            appendiceal orifice, and rectum were photographed.                            The bowel preparation used was GoLYTELY. Scope In: 9:17:37 AM Scope Out: 9:35:39 AM Scope Withdrawal Time: 0 hours 14 minutes 25 seconds  Total Procedure Duration: 0 hours 18 minutes 2 seconds  Findings:                 The perianal and digital rectal examinations were                            normal.                           Two sessile polyps were found in the ascending                            colon and cecum. The polyps were 2 to 3 mm in size.                            These polyps were removed with a cold biopsy  forceps. Resection and retrieval were complete.                           Retroflexion in the rectum was not performed due to                            anatomy.                           The exam was otherwise without abnormality. Complications:            No immediate complications. Estimated Blood Loss:     Estimated blood loss was minimal. Impression:               - Two 2 to 3 mm polyps in the ascending colon and                            in the cecum, removed with a cold biopsy forceps.                            Resected and retrieved.                           - The examination was otherwise normal. Recommendation:           - Patient has a contact number available for                            emergencies. The signs and symptoms of potential                            delayed complications were discussed with the                            patient. Return  to normal activities tomorrow.                            Written discharge instructions were provided to the                            patient.                           - Resume previous diet.                           - Continue present medications.                           - Await pathology results.                           - Based on age and current guidelines, no repeat                            surveillance colonoscopy necessary. [employer] Null L. Loletha Carrow, MD 05/25/2019 9:44:44 AM This report has  been signed electronically.

## 2019-05-25 NOTE — Progress Notes (Signed)
Report given to PACU, vss 

## 2019-05-27 ENCOUNTER — Telehealth: Payer: Self-pay | Admitting: *Deleted

## 2019-05-27 ENCOUNTER — Telehealth: Payer: Self-pay

## 2019-05-27 ENCOUNTER — Encounter: Payer: Self-pay | Admitting: Gastroenterology

## 2019-05-27 NOTE — Telephone Encounter (Signed)
Patient's son returned your follow-up call after his procedure. He stated that pt is feeling fine.

## 2019-05-27 NOTE — Telephone Encounter (Signed)
  Follow up Call-  Call back number 05/25/2019  Post procedure Call Back phone  # [phone]  Permission to leave phone message Yes  Some recent data might be hidden    No answer, no machine to leave a message

## 2019-05-27 NOTE — Telephone Encounter (Signed)
  Follow up Call-  Call back number 05/25/2019  Post procedure Call Back phone  # [phone]  Permission to leave phone message Yes  Some recent data might be hidden     No answer.  Needs interpreter.

## 2019-10-20 ENCOUNTER — Other Ambulatory Visit: Payer: Self-pay

## 2019-10-20 ENCOUNTER — Ambulatory Visit (INDEPENDENT_AMBULATORY_CARE_PROVIDER_SITE_OTHER): Payer: Medicare Other | Admitting: Family Medicine

## 2019-10-20 ENCOUNTER — Encounter: Payer: Self-pay | Admitting: Family Medicine

## 2019-10-20 ENCOUNTER — Ambulatory Visit (INDEPENDENT_AMBULATORY_CARE_PROVIDER_SITE_OTHER)
Admission: RE | Admit: 2019-10-20 | Discharge: 2019-10-20 | Disposition: A | Discharge status: Home / Self Care | Payer: Medicare Other | Source: Ambulatory Visit | Attending: Family Medicine | Admitting: Family Medicine

## 2019-10-20 DIAGNOSIS — M549 Dorsalgia, unspecified: Secondary | ICD-10-CM

## 2019-10-20 DIAGNOSIS — M545 Low back pain, unspecified: Secondary | ICD-10-CM

## 2019-10-20 MED ORDER — CYCLOBENZAPRINE HCL 5 MG PO TABS: 5.0000 mg | ORAL_TABLET | Freq: Every evening | ORAL | 0 refills | Status: DC | PRN
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
## 2019-10-23 ENCOUNTER — Encounter: Payer: Self-pay | Admitting: Family Medicine

## 2020-01-19 ENCOUNTER — Encounter: Payer: Medicare Other | Admitting: Family Medicine

## 2020-05-28 ENCOUNTER — Telehealth: Payer: Self-pay | Admitting: Family Medicine

## 2020-05-28 NOTE — Telephone Encounter (Signed)
Spoke with pt son who is listed on pt  DPR advised to call pt Gi and schedule appointment, verbalized understanding

## 2020-05-28 NOTE — Telephone Encounter (Signed)
Constipation has not had a bowel movement in 2 weeks pt scheduled for 05/31/2019 @4 :00. need to know what to do in the meantime about constipation please call to advise  Son Keelin Sheridan. [phone]

## 2020-05-30 ENCOUNTER — Ambulatory Visit: Payer: Medicare Other | Admitting: Family Medicine

## 2020-05-31 ENCOUNTER — Ambulatory Visit (INDEPENDENT_AMBULATORY_CARE_PROVIDER_SITE_OTHER): Payer: Medicare Other | Admitting: Gastroenterology

## 2020-05-31 ENCOUNTER — Encounter: Payer: Self-pay | Admitting: Gastroenterology

## 2020-05-31 VITALS — BP 106/68 | HR 62 | Ht 64.0 in | Wt 151.0 lb

## 2020-05-31 DIAGNOSIS — K5909 Other constipation: Secondary | ICD-10-CM | POA: Diagnosis not present

## 2020-05-31 DIAGNOSIS — K219 Gastro-esophageal reflux disease without esophagitis: Secondary | ICD-10-CM

## 2020-05-31 MED ORDER — PANTOPRAZOLE SODIUM 20 MG PO TBEC: 20.0000 mg | DELAYED_RELEASE_TABLET | Freq: Every day | ORAL | 3 refills | Status: DC

## 2020-05-31 NOTE — Progress Notes (Signed)
[facility] Progress Note  Chief Complaint: Abdominal pain and constipation  Subjective  History:  This patient was last seen for his first screening colonoscopy with me January 2021.  Complete exam to terminal ileum, good preparation, diminutive right colon tubular adenoma and diminutive right colon SSP removed.  Given those findings, age and current guidelines, no surveillance colonoscopy recommended.  This patient was here with his son today who also acts as Optometrist.Abubakar has had intermittent problems with constipation over the years, and had told his son that after his colonoscopy last year, he felt quite well for couple of weeks afterwards (presumably due to bowel preparation).  He has had more severe constipation in the last few weeks, with only 1 or 2 small BMs in the last couple of weeks.  He denies rectal bleeding, he denies distention, nausea vomiting dysphagia early satiety or weight loss.  His appetite is reportedly remain generally good, weight stable.  He has a burning left-sided abdominal discomfort in the last couple of weeks. He has not been taking any particular treatments for the constipation.  Lastly, he has chronic stable intermittent heartburn for which she takes as needed pantoprazole.  Refill was requested and done.  ROS: Cardiovascular:  no chest pain Respiratory: no dyspnea  The patient's Past Medical, Family and Social History were reviewed and are on file in the EMR.  Objective:  Med list reviewed  Current Outpatient Medications:  .  pantoprazole (PROTONIX) 20 MG tablet, Take 1 tablet (20 mg total) by mouth daily. (Patient taking differently: Take 20 mg by mouth daily. Pt report taking prn), Disp: 30 tablet, Rfl: 3 .  cyclobenzaprine (FLEXERIL) 5 MG tablet, Take 1 tablet (5 mg total) by mouth at bedtime as needed for muscle spasms. (Patient not taking: Reported on 05/31/2020), Disp: 30 tablet, Rfl: 0   Vital signs in last 24 hrs: Vitals:   05/31/20  1549  BP: 106/68  Pulse: 62  SpO2: 99%   Wt Readings from Last 3 Encounters:  05/31/20 151 lb (68.5 kg)  10/20/19 148 lb 12.8 oz (67.5 kg)  05/25/19 144 lb 12.8 oz (65.7 kg)    Physical Exam  He is well-appearing.  HEENT: sclera anicteric, oral mucosa moist without lesions  Neck: supple, no thyromegaly, JVD or lymphadenopathy  Cardiac: RRR without murmurs, S1S2 heard, no peripheral edema  Pulm: clear to auscultation bilaterally, normal RR and effort noted  Abdomen: soft, mild LLQ tenderness, with active bowel sounds. No guarding or palpable hepatosplenomegaly.  Skin; warm and dry, no jaundice or rash  Labs:   ___________________________________________ Radiologic studies:   ____________________________________________ Other:   _____________________________________________ Assessment & Plan  Assessment: Encounter Diagnoses  Name Primary?  . Chronic constipation Yes  . Gastroesophageal reflux disease, unspecified whether esophagitis present    Acute on chronic constipation, worse last few weeks.  Doubt obstructing lesion with low risk findings a year ago.  No diverticulosis on that exam. Appears well overall, no other clinical signs to suggest hypothyroidism.  On no medicines that would be causing this.  He needs a bowel purge followed by maintenance therapy.  Fleets enema x2 followed by a bottle of magnesium citrate.  Then start MiraLAX 1 capful in a large glass of water daily.  His son was encouraged to call us by early next week, especially if he does not seem to have had sufficient results.  Clinic follow-up with me will be arranged.  22 minutes were spent on this encounter (including chart review, history/exam, counseling/coordination  of care, and documentation) > 50% of that time was spent on counseling and coordination of care.  Topics discussed included: Constipation and its treatment.  Nelida Meuse III

## 2020-05-31 NOTE — Patient Instructions (Signed)
If you are age 71 or older, your body mass index should be between 23-30. Your Body mass index is 25.92 kg/m. If this is out of the aforementioned range listed, please consider follow up with your Primary Care Provider.  If you are age 58 or younger, your body mass index should be between 19-25. Your Body mass index is 25.92 kg/m. If this is out of the aformentioned range listed, please consider follow up with your Primary Care Provider.   Clean out- fleet enema x 2  Magnesium citrate- 1/2 bottle, wait one hour, take other half. Then Miralax 1 capful a day   Follow up in 4-6 weeks  call sooner if needed   It was a pleasure to see you today!  Dr. Loletha Carrow

## 2020-07-12 ENCOUNTER — Ambulatory Visit: Payer: Medicare Other | Admitting: Gastroenterology

## 2020-07-16 ENCOUNTER — Telehealth: Payer: Self-pay | Admitting: Family Medicine

## 2020-07-16 NOTE — Telephone Encounter (Signed)
Tried calling patient to  schedule Medicare Annual Wellness Visit (AWV) either virtually or in office.  No answer   awvi  please schedule at anytime with [facility] Nurse Health Advisor 1 or 2   This should be a 45 minute visit.

## 2020-10-17 ENCOUNTER — Telehealth: Payer: Self-pay | Admitting: Family Medicine

## 2020-10-17 NOTE — Telephone Encounter (Signed)
Left message for patient to call back and schedule Medicare Annual Wellness Visit (AWV) either virtually or in office.   AWV-I per [place] 08/18/15  please schedule at anytime with [facility] Nurse Health Advisor 1 or 2   This should be a 45 minute visit.
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
## 2020-11-05 ENCOUNTER — Ambulatory Visit (INDEPENDENT_AMBULATORY_CARE_PROVIDER_SITE_OTHER): Payer: Medicare Other | Admitting: Family Medicine

## 2020-11-05 ENCOUNTER — Encounter: Payer: Self-pay | Admitting: Family Medicine

## 2020-11-05 ENCOUNTER — Other Ambulatory Visit: Payer: Self-pay

## 2020-11-05 VITALS — BP 112/72 | HR 66 | Temp 98.2°F | Wt 149.6 lb

## 2020-11-05 DIAGNOSIS — R1084 Generalized abdominal pain: Secondary | ICD-10-CM

## 2020-11-05 DIAGNOSIS — K5909 Other constipation: Secondary | ICD-10-CM

## 2020-11-05 LAB — POCT URINALYSIS DIPSTICK
Blood, UA: NEGATIVE
Glucose, UA: NEGATIVE
Ketones, UA: NEGATIVE
Leukocytes, UA: NEGATIVE
Nitrite, UA: NEGATIVE
Protein, UA: NEGATIVE
Spec Grav, UA: >=1.03 — AB (ref 1.010–1.025)
Urobilinogen, UA: NEGATIVE E.U./dL — AB
pH, UA: 6 (ref 5.0–8.0)

## 2020-11-05 MED ORDER — LACTULOSE 10 GM/15ML PO SOLN: 10.0000 g | Freq: Every day | ORAL | 0 refills | Status: AC

## 2020-11-05 MED ORDER — POLYETHYLENE GLYCOL 3350 17 GM/SCOOP PO POWD
17.0000 g | Freq: Two times a day (BID) | ORAL | 1 refills | Status: DC | PRN
Start: 2020-11-05 — End: 2024-03-10

## 2020-11-05 NOTE — Progress Notes (Signed)
Subjective:    Patient ID: Brendan Alexander, male    DOB: 1951-05-15, 70 y.o.   MRN: [id number]  Chief Complaint  Patient presents with   Pain    Shooting pain from left side of rib/ chest to back, sometimes burning sensation.    [place] interpreter on phone and patient's grandson present.  HPI Patient was seen today for ongoing concern.  Pt with continued abdominal pain and constipation.  Also notes left-sided lower CP with burning sensation.  May last 1-2 hours especially at night.  Sensation noted as uncomfortable.  Patient also endorses abdominal bloating and "stomach feeling hard".  Patient previously taking Protonix for GERD.  Noted improvement in symptoms when on MiraLAX.  Seen by GI.  Past Medical History:  Diagnosis Date   Constipation    GERD (gastroesophageal reflux disease)     No Known Allergies  ROS General: Denies fever, chills, night sweats, changes in weight, changes in appetite HEENT: Denies headaches, ear pain, changes in vision, rhinorrhea, sore throat CV: Denies CP, palpitations, SOB, orthopnea   Pulm: Denies SOB, cough, wheezing GI: Denies abdominal pain, nausea, vomiting, diarrhea  +constipation, abdominal pain GU: Denies dysuria, hematuria, frequency Msk: Denies muscle cramps, joint pains Neuro: Denies weakness, numbness, tingling  +burning sensation of L side Skin: Denies rashes, bruising Psych: Denies depression, anxiety, hallucinations     Objective:    Blood pressure 112/72, pulse 66, temperature 98.2 F (36.8 C), temperature source Oral, weight 149 lb 9.6 oz (67.9 kg), SpO2 97 %.  Gen. Pleasant, well-nourished, in no distress, normal affect   HEENT: [place]/AT, face symmetric, conjunctiva clear, no scleral icterus, PERRLA, EOMI, nares patent without drainage, pharynx without erythema or exudate. Neck: No JVD, no thyromegaly, no carotid bruits Lungs: no accessory muscle use, CTAB, no wheezes or rales Cardiovascular: RRR, no m/r/g, no peripheral  edema Abdomen: BS present, mild distention, TTP of no rebounding, stool palpable, no hepatosplenomegaly. Musculoskeletal: No deformities, no cyanosis or clubbing, normal tone Neuro:  A&Ox3, CN II-XII intact, normal gait Skin:  Warm, no lesions/ rash   Wt Readings from Last 3 Encounters:  05/31/20 151 lb (68.5 kg)  10/20/19 148 lb 12.8 oz (67.5 kg)  05/25/19 144 lb 12.8 oz (65.7 kg)    Lab Results  Component Value Date   WBC 8.2 01/19/2019   HGB 14.2 01/19/2019   HCT 44.0 01/19/2019   PLT 190.0 01/19/2019   GLUCOSE 77 01/19/2019   CHOL 178 01/19/2019   TRIG 62.0 01/19/2019   HDL 54.40 01/19/2019   LDLCALC 112 (H) 01/19/2019   ALT 25 01/19/2019   AST 27 01/19/2019   NA 140 01/19/2019   K 4.3 01/19/2019   CL 105 01/19/2019   CREATININE 1.07 01/19/2019   BUN 20 01/19/2019   CO2 29 01/19/2019   TSH 0.84 02/28/2019    Assessment/Plan:  Chronic constipation  -discussed the importance of a daily bowel regimen -will obtain labs -discussed clean out.  Will start lactulose once daily x 2 days, then daily miralax. -advised to increase po intake of water and fluids. -consider OTC probiotic -advised to keep a food diary. -For continued symptoms consider medication options such as Linzess. -Continue follow-up with GI - Plan: CMP, CBC with Differential/Platelet, TSH, T4, Free, Lipase, lactulose (CHRONULAC) 10 GM/15ML solution, polyethylene glycol powder (GLYCOLAX/MIRALAX) 17 GM/SCOOP powder  Generalized abdominal pain  -advised likely multifactorial from GERD and chronic constipation -Consider restarting Protonix daily -Advised to avoid foods known to cause heartburn symptoms.  Also discussed waiting  at least 2 hours between a meal and laying down -Continue follow-up with GI. - Plan: CMP, CBC with Differential/Platelet, TSH, T4, Free, Lipase, POCT urinalysis dipstick  F/u in 1 month, sooner if needed.  Grier Mitts, MD

## 2020-11-06 LAB — COMPREHENSIVE METABOLIC PANEL
ALT: 59 U/L — ABNORMAL HIGH (ref 0–53)
AST: 49 U/L — ABNORMAL HIGH (ref 0–37)
Albumin: 4.3 g/dL (ref 3.5–5.2)
Alkaline Phosphatase: 68 U/L (ref 39–117)
BUN: 15 mg/dL (ref 6–23)
CO2: 28 mEq/L (ref 19–32)
Calcium: 9.9 mg/dL (ref 8.4–10.5)
Chloride: 104 mEq/L (ref 96–112)
Creatinine, Ser: 1.2 mg/dL (ref 0.40–1.50)
GFR: 60.97 mL/min (ref >60.00)
Glucose, Bld: 88 mg/dL (ref 70–99)
Potassium: 4.1 mEq/L (ref 3.5–5.1)
Sodium: 139 mEq/L (ref 135–145)
Total Bilirubin: 0.7 mg/dL (ref 0.2–1.2)
Total Protein: 7.7 g/dL (ref 6.0–8.3)

## 2020-11-06 LAB — CBC WITH DIFFERENTIAL/PLATELET
Basophils Absolute: 0.1 10*3/uL (ref 0.0–0.1)
Basophils Relative: 1.2 % (ref 0.0–3.0)
Eosinophils Absolute: 0.3 10*3/uL (ref 0.0–0.7)
Eosinophils Relative: 4.1 % (ref 0.0–5.0)
HCT: 45.4 % (ref 39.0–52.0)
Hemoglobin: 15 g/dL (ref 13.0–17.0)
Lymphocytes Relative: 24.4 % (ref 12.0–46.0)
Lymphs Abs: 1.6 10*3/uL (ref 0.7–4.0)
MCHC: 33 g/dL (ref 30.0–36.0)
MCV: 85.3 fl (ref 78.0–100.0)
Monocytes Absolute: 0.5 10*3/uL (ref 0.1–1.0)
Monocytes Relative: 7.8 % (ref 3.0–12.0)
Neutro Abs: 4.2 10*3/uL (ref 1.4–7.7)
Neutrophils Relative %: 62.5 % (ref 43.0–77.0)
Platelets: 184 10*3/uL (ref 150.0–400.0)
RBC: 5.32 Mil/uL (ref 4.22–5.81)
RDW: 13.9 % (ref 11.5–15.5)
WBC: 6.7 10*3/uL (ref 4.0–10.5)

## 2020-11-06 LAB — T4, FREE: Free T4: 1.13 ng/dL (ref 0.60–1.60)

## 2020-11-06 LAB — TSH: TSH: 1.18 u[IU]/mL (ref 0.35–4.50)

## 2020-11-06 LAB — LIPASE: Lipase: 33 U/L (ref 11.0–59.0)

## 2020-11-07 ENCOUNTER — Other Ambulatory Visit: Payer: Self-pay | Admitting: Family Medicine

## 2020-11-07 DIAGNOSIS — R7989 Other specified abnormal findings of blood chemistry: Secondary | ICD-10-CM

## 2020-11-21 ENCOUNTER — Encounter: Payer: Self-pay | Admitting: Family Medicine

## 2020-11-23 ENCOUNTER — Ambulatory Visit: Payer: Medicare Other | Admitting: Family Medicine

## 2020-11-30 ENCOUNTER — Other Ambulatory Visit: Payer: Medicare Other

## 2020-12-03 ENCOUNTER — Ambulatory Visit
Admission: RE | Admit: 2020-12-03 | Discharge: 2020-12-03 | Disposition: A | Discharge status: Home / Self Care | Payer: Medicare Other | Source: Ambulatory Visit | Attending: Family Medicine | Admitting: Family Medicine

## 2020-12-03 DIAGNOSIS — R945 Abnormal results of liver function studies: Secondary | ICD-10-CM | POA: Diagnosis not present

## 2020-12-03 DIAGNOSIS — K5900 Constipation, unspecified: Secondary | ICD-10-CM | POA: Diagnosis not present

## 2020-12-03 DIAGNOSIS — R7989 Other specified abnormal findings of blood chemistry: Secondary | ICD-10-CM

## 2020-12-04 ENCOUNTER — Other Ambulatory Visit: Payer: Self-pay

## 2020-12-04 ENCOUNTER — Ambulatory Visit (INDEPENDENT_AMBULATORY_CARE_PROVIDER_SITE_OTHER): Payer: Medicare Other

## 2020-12-04 VITALS — BP 110/70 | HR 69 | Temp 98.1°F | Wt 151.1 lb

## 2020-12-04 DIAGNOSIS — Z0000 Encounter for general adult medical examination without abnormal findings: Secondary | ICD-10-CM

## 2020-12-04 NOTE — Patient Instructions (Signed)
Mr. Piech , Thank you for taking time to come for your Medicare Wellness Visit. I appreciate your ongoing commitment to your health goals. Please review the following plan we discussed and let me know if I can assist you in the future.   Screening recommendations/referrals: Colonoscopy: Not a candidate  Recommended yearly ophthalmology/optometry visit for glaucoma screening and checkup Recommended yearly dental visit for hygiene and checkup  Vaccinations: Influenza vaccine: Declined Pneumococcal vaccine: Declined Tdap vaccine: declined Shingles vaccine: Shingrix discussed. Please contact your pharmacy for coverage information.    Covid-19: Declined and discussed  Advanced directives: copies in chart  Conditions/risks identified: None at this time  Next appointment: Follow up in one year for your annual wellness visit.   Preventive Care 71 Years and Older, Male Preventive care refers to lifestyle choices and visits with your health care provider that can promote health and wellness. What does preventive care include? A yearly physical exam. This is also called an annual well check. Dental exams once or twice a year. Routine eye exams. Ask your health care provider how often you should have your eyes checked. Personal lifestyle choices, including: Daily care of your teeth and gums. Regular physical activity. Eating a healthy diet. Avoiding tobacco and drug use. Limiting alcohol use. Practicing safe sex. Taking low doses of aspirin every day. Taking vitamin and mineral supplements as recommended by your health care provider. What happens during an annual well check? The services and screenings done by your health care provider during your annual well check will depend on your age, overall health, lifestyle risk factors, and family history of disease. Counseling  Your health care provider may ask you questions about your: Alcohol use. Tobacco use. Drug use. Emotional  well-being. Home and relationship well-being. Sexual activity. Eating habits. History of falls. Memory and ability to understand (cognition). Work and work Statistician. Screening  You may have the following tests or measurements: Height, weight, and BMI. Blood pressure. Lipid and cholesterol levels. These may be checked every 5 years, or more frequently if you are over 71 years old. Skin check. Lung cancer screening. You may have this screening every year starting at age 71 if you have a 30-pack-year history of smoking and currently smoke or have quit within the past 15 years. Fecal occult blood test (FOBT) of the stool. You may have this test every year starting at age 71. Flexible sigmoidoscopy or colonoscopy. You may have a sigmoidoscopy every 5 years or a colonoscopy every 10 years starting at age 71. Prostate cancer screening. Recommendations will vary depending on your family history and other risks. Hepatitis C blood test. Hepatitis B blood test. Sexually transmitted disease (STD) testing. Diabetes screening. This is done by checking your blood sugar (glucose) after you have not eaten for a while (fasting). You may have this done every 1-3 years. Abdominal aortic aneurysm (AAA) screening. You may need this if you are a current or former smoker. Osteoporosis. You may be screened starting at age 71 if you are at high risk. Talk with your health care provider about your test results, treatment options, and if necessary, the need for more tests. Vaccines  Your health care provider may recommend certain vaccines, such as: Influenza vaccine. This is recommended every year. Tetanus, diphtheria, and acellular pertussis (Tdap, Td) vaccine. You may need a Td booster every 10 years. Zoster vaccine. You may need this after age 71. Pneumococcal 13-valent conjugate (PCV13) vaccine. One dose is recommended after age 71. Pneumococcal polysaccharide (PPSV23) vaccine.  One dose is recommended after  age 71. Talk to your health care provider about which screenings and vaccines you need and how often you need them. This information is not intended to replace advice given to you by your health care provider. Make sure you discuss any questions you have with your health care provider. Document Released: 06/01/2015 Document Revised: 01/23/2016 Document Reviewed: 03/06/2015 Elsevier Interactive Patient Education  2017 [place] Prevention in the Home Falls can cause injuries. They can happen to people of all ages. There are many things you can do to make your home safe and to help prevent falls. What can I do on the outside of my home? Regularly fix the edges of walkways and driveways and fix any cracks. Remove anything that might make you trip as you walk through a door, such as a raised step or threshold. Trim any bushes or trees on the path to your home. Use bright outdoor lighting. Clear any walking paths of anything that might make someone trip, such as rocks or tools. Regularly check to see if handrails are loose or broken. Make sure that both sides of any steps have handrails. Any raised decks and porches should have guardrails on the edges. Have any leaves, snow, or ice cleared regularly. Use sand or salt on walking paths during winter. Clean up any spills in your garage right away. This includes oil or grease spills. What can I do in the bathroom? Use night lights. Install grab bars by the toilet and in the tub and shower. Do not use towel bars as grab bars. Use non-skid mats or decals in the tub or shower. If you need to sit down in the shower, use a plastic, non-slip stool. Keep the floor dry. Clean up any water that spills on the floor as soon as it happens. Remove soap buildup in the tub or shower regularly. Attach bath mats securely with double-sided non-slip rug tape. Do not have throw rugs and other things on the floor that can make you trip. What can I do in the  bedroom? Use night lights. Make sure that you have a light by your bed that is easy to reach. Do not use any sheets or blankets that are too big for your bed. They should not hang down onto the floor. Have a firm chair that has side arms. You can use this for support while you get dressed. Do not have throw rugs and other things on the floor that can make you trip. What can I do in the kitchen? Clean up any spills right away. Avoid walking on wet floors. Keep items that you use a lot in easy-to-reach places. If you need to reach something above you, use a strong step stool that has a grab bar. Keep electrical cords out of the way. Do not use floor polish or wax that makes floors slippery. If you must use wax, use non-skid floor wax. Do not have throw rugs and other things on the floor that can make you trip. What can I do with my stairs? Do not leave any items on the stairs. Make sure that there are handrails on both sides of the stairs and use them. Fix handrails that are broken or loose. Make sure that handrails are as long as the stairways. Check any carpeting to make sure that it is firmly attached to the stairs. Fix any carpet that is loose or worn. Avoid having throw rugs at the top or bottom of  the stairs. If you do have throw rugs, attach them to the floor with carpet tape. Make sure that you have a light switch at the top of the stairs and the bottom of the stairs. If you do not have them, ask someone to add them for you. What else can I do to help prevent falls? Wear shoes that: Do not have high heels. Have rubber bottoms. Are comfortable and fit you well. Are closed at the toe. Do not wear sandals. If you use a stepladder: Make sure that it is fully opened. Do not climb a closed stepladder. Make sure that both sides of the stepladder are locked into place. Ask someone to hold it for you, if possible. Clearly mark and make sure that you can see: Any grab bars or  handrails. First and last steps. Where the edge of each step is. Use tools that help you move around (mobility aids) if they are needed. These include: Canes. Walkers. Scooters. Crutches. Turn on the lights when you go into a dark area. Replace any light bulbs as soon as they burn out. Set up your furniture so you have a clear path. Avoid moving your furniture around. If any of your floors are uneven, fix them. If there are any pets around you, be aware of where they are. Review your medicines with your doctor. Some medicines can make you feel dizzy. This can increase your chance of falling. Ask your doctor what other things that you can do to help prevent falls. This information is not intended to replace advice given to you by your health care provider. Make sure you discuss any questions you have with your health care provider. Document Released: 03/01/2009 Document Revised: 10/11/2015 Document Reviewed: 06/09/2014 Elsevier Interactive Patient Education  2017 [employer].

## 2020-12-04 NOTE — Progress Notes (Addendum)
Subjective:   Brendan Alexander is a 70 y.o. male who presents for an Initial Medicare Annual Wellness Visit.  Review of Systems     Cardiac Risk Factors include: advanced age (>59men, >86 women);male gender     Objective:    Today's Vitals   12/04/20 1349  BP: 110/70  Pulse: 69  Temp: 98.1 F (36.7 C)  SpO2: 98%  Weight: 151 lb 1.6 oz (68.5 kg)   Body mass index is 25.94 kg/m.  No flowsheet data found.  Current Medications (verified) Outpatient Encounter Medications as of 12/04/2020  Medication Sig   cyclobenzaprine (FLEXERIL) 5 MG tablet Take 1 tablet (5 mg total) by mouth at bedtime as needed for muscle spasms.   pantoprazole (PROTONIX) 20 MG tablet Take 1 tablet (20 mg total) by mouth daily.   polyethylene glycol powder (GLYCOLAX/MIRALAX) 17 GM/SCOOP powder Take 17 g by mouth 2 (two) times daily as needed.   No facility-administered encounter medications on file as of 12/04/2020.    Allergies (verified) Patient has no known allergies.   History: Past Medical History:  Diagnosis Date   Constipation    GERD (gastroesophageal reflux disease)    Past Surgical History:  Procedure Laterality Date   HAND SURGERY     Family History  Problem Relation Age of Onset   Colon cancer Neg Hx    Colon polyps Neg Hx    Esophageal cancer Neg Hx    Rectal cancer Neg Hx    Stomach cancer Neg Hx    Social History   Socioeconomic History   Marital status: Married    Spouse name: Not on file   Number of children: 4   Years of education: 10   Highest education level: Not on file  Occupational History   Not on file  Tobacco Use   Smoking status: Former    Packs/day: 0.50    Years: 45.00    Pack years: 22.50    Types: Cigarettes   Smokeless tobacco: Never  Substance and Sexual Activity   Alcohol use: Yes    Alcohol/week: 0.0 standard drinks    Comment: socially   Drug use: No   Sexual activity: Not on file  Other Topics Concern   Not on file  Social History  Narrative   Fun: Read, garden   Denies religious beliefs effecting health care.    Social Determinants of Health   Financial Resource Strain: Low Risk    Difficulty of Paying Living Expenses: Not hard at all  Food Insecurity: No Food Insecurity   Worried About Charity fundraiser in the Last Year: Never true   [place] in the Last Year: Never true  Transportation Needs: No Transportation Needs   Lack of Transportation (Medical): No   Lack of Transportation (Non-Medical): No  Physical Activity: Inactive   Days of Exercise per Week: 0 days   Minutes of Exercise per Session: 0 min  Stress: No Stress Concern Present   Feeling of Stress : Not at all  Social Connections: Moderately Integrated   Frequency of Communication with Friends and Family: Twice a week   Frequency of Social Gatherings with Friends and Family: Three times a week   Attends Religious Services: 1 to 4 times per year   Active Member of Clubs or Organizations: No   Attends Archivist Meetings: Never   Marital Status: Married    Tobacco Counseling Counseling given: Not Answered   Clinical Intake:  Pre-visit preparation completed:  Yes  Pain : No/denies pain     BMI - recorded: 25.94 Nutritional Status: BMI 25 -29 Overweight Nutritional Risks: None Diabetes: No  How often do you need to have someone help you when you read instructions, pamphlets, or other written materials from your doctor or pharmacy?: 1 - Never  [place] Needed?: Yes Interpreter Name: Beatrix Shipper  Information entered by :: Charlott Rakes, LPN   Activities of Daily Living In your present state of health, do you have any difficulty performing the following activities: 12/04/2020  Hearing? N  Vision? N  Difficulty concentrating or making decisions? N  Walking or climbing stairs? N  Dressing or bathing? N  Doing errands, shopping? N  Preparing Food and eating ? N  Using the Toilet? N  In the past  six months, have you accidently leaked urine? N  Do you have problems with loss of bowel control? N  Managing your Medications? N  Managing your Finances? N  Housekeeping or managing your Housekeeping? N  Some recent data might be hidden    Patient Care Team: Billie Ruddy, MD as PCP - General (Family Medicine)  Indicate any recent Medical Services you may have received from other than [facility] providers in the past year (date may be approximate).     Assessment:   This is a routine wellness examination for Brendan Alexander.  Hearing/Vision screen Hearing Screening - Comments:: Pt denies any hearing issues Vision Screening - Comments:: Pt follows up with [facility] for annual eye exams   Dietary issues and exercise activities discussed: Current Exercise Habits: The patient does not participate in regular exercise at present   Goals Addressed             This Visit's Progress    none at this time         Depression Screen PHQ 2/9 Scores 12/04/2020  PHQ - 2 Score 0    Fall Risk Fall Risk  12/04/2020  Falls in the past year? 0  Number falls in past yr: 0  Injury with Fall? 0  Risk for fall due to : Impaired vision  Follow up Falls prevention discussed    FALL RISK PREVENTION PERTAINING TO THE HOME:  Any stairs in or around the home? Yes  If so, are there any without handrails? No  Home free of loose throw rugs in walkways, pet beds, electrical cords, etc? Yes  Adequate lighting in your home to reduce risk of falls? Yes   ASSISTIVE DEVICES UTILIZED TO PREVENT FALLS:  Life alert? No  Use of a cane, walker or w/c? No  Grab bars in the bathroom? No  Shower chair or bench in shower? No  Elevated toilet seat or a handicapped toilet? No   TIMED UP AND GO:  Was the test performed? No .  Length of time to ambulate 10 feet: 10 sec.   Gait steady and fast without use of assistive device  Cognitive Function:     6CIT Screen 12/04/2020  What Year? 0 points  What month? 0  points  What time? 0 points  Count back from 20 0 points  Months in reverse 4 points  Repeat phrase 10 points  Total Score 14    Immunizations  There is no immunization history on file for this patient.  TDAP status: Due, Education has been provided regarding the importance of this vaccine. Advised may receive this vaccine at local pharmacy or Health Dept. Aware to provide a copy of the  vaccination record if obtained from local pharmacy or Health Dept. Verbalized acceptance and understanding.  Flu Vaccine status: Declined, Education has been provided regarding the importance of this vaccine but patient still declined. Advised may receive this vaccine at local pharmacy or Health Dept. Aware to provide a copy of the vaccination record if obtained from local pharmacy or Health Dept. Verbalized acceptance and understanding.  Pneumococcal vaccine status: Declined,  Education has been provided regarding the importance of this vaccine but patient still declined. Advised may receive this vaccine at local pharmacy or Health Dept. Aware to provide a copy of the vaccination record if obtained from local pharmacy or Health Dept. Verbalized acceptance and understanding.   Covid-19 vaccine status: Declined, Education has been provided regarding the importance of this vaccine but patient still declined. Advised may receive this vaccine at local pharmacy or Health Dept.or vaccine clinic. Aware to provide a copy of the vaccination record if obtained from local pharmacy or Health Dept. Verbalized acceptance and understanding.  Qualifies for Shingles Vaccine? Yes   Zostavax completed No   Shingrix Completed?: No.    Education has been provided regarding the importance of this vaccine. Patient has been advised to call insurance company to determine out of pocket expense if they have not yet received this vaccine. Advised may also receive vaccine at local pharmacy or Health Dept. Verbalized acceptance and  understanding.  Screening Tests Health Maintenance  Topic Date Due   Hepatitis C Screening  Never done   COVID-19 Vaccine (1) 12/20/2020 (Originally 08/19/1955)   Zoster Vaccines- Shingrix (1 of 2) 03/06/2021 (Originally 08/19/1999)   TETANUS/TDAP  12/04/2021 (Originally [birth year]/1968)   PNA vac Low Risk Adult (1 of 2 - PCV13) 12/04/2021 (Originally 08/19/2014)   INFLUENZA VACCINE  12/17/2020   HPV VACCINES  Aged Out    Health Maintenance  Health Maintenance Due  Topic Date Due   Hepatitis C Screening  Never done    Colorectal cancer screening: No longer required.     Additional Screening:  Hepatitis C Screening: does qualify;   Vision Screening: Recommended annual ophthalmology exams for early detection of glaucoma and other disorders of the eye. Is the patient up to date with their annual eye exam?  Yes  Who is the provider or what is the name of the office in which the patient attends annual eye exams? [facility] If pt is not established with a provider, would they like to be referred to a provider to establish care? No .   Dental Screening: Recommended annual dental exams for proper oral hygiene  Community Resource Referral / Chronic Care Management: CRR required this visit?  No   CCM required this visit?  No      Plan:     I have personally reviewed and noted the following in the patient's chart:   Medical and social history Use of alcohol, tobacco or illicit drugs  Current medications and supplements including opioid prescriptions. Patient is not currently taking opioid prescriptions. Functional ability and status Nutritional status Physical activity Advanced directives List of other physicians Hospitalizations, surgeries, and ER visits in previous 12 months Vitals Screenings to include cognitive, depression, and falls Referrals and appointments  In addition, I have reviewed and discussed with patient certain preventive protocols, quality metrics, and best  practice recommendations. A written personalized care plan for preventive services as well as general preventive health recommendations were provided to patient.     Willette Brace, LPN   [id number]   Nurse Notes: None

## 2020-12-12 ENCOUNTER — Ambulatory Visit (INDEPENDENT_AMBULATORY_CARE_PROVIDER_SITE_OTHER): Payer: Medicare Other | Admitting: Family Medicine

## 2020-12-12 ENCOUNTER — Other Ambulatory Visit: Payer: Self-pay

## 2020-12-12 ENCOUNTER — Encounter: Payer: Self-pay | Admitting: Family Medicine

## 2020-12-12 VITALS — BP 112/82 | HR 61 | Temp 98.7°F | Wt 152.0 lb

## 2020-12-12 DIAGNOSIS — R7989 Other specified abnormal findings of blood chemistry: Secondary | ICD-10-CM

## 2020-12-12 DIAGNOSIS — K219 Gastro-esophageal reflux disease without esophagitis: Secondary | ICD-10-CM

## 2020-12-12 DIAGNOSIS — R609 Edema, unspecified: Secondary | ICD-10-CM | POA: Diagnosis not present

## 2020-12-12 DIAGNOSIS — K5909 Other constipation: Secondary | ICD-10-CM | POA: Diagnosis not present

## 2020-12-12 LAB — COMPREHENSIVE METABOLIC PANEL
ALT: 70 U/L — ABNORMAL HIGH (ref 0–53)
AST: 49 U/L — ABNORMAL HIGH (ref 0–37)
Albumin: 4.1 g/dL (ref 3.5–5.2)
Alkaline Phosphatase: 67 U/L (ref 39–117)
BUN: 18 mg/dL (ref 6–23)
CO2: 26 mEq/L (ref 19–32)
Calcium: 9.4 mg/dL (ref 8.4–10.5)
Chloride: 106 mEq/L (ref 96–112)
Creatinine, Ser: 1.12 mg/dL (ref 0.40–1.50)
GFR: 66.18 mL/min (ref >60.00)
Glucose, Bld: 91 mg/dL (ref 70–99)
Potassium: 3.8 mEq/L (ref 3.5–5.1)
Sodium: 139 mEq/L (ref 135–145)
Total Bilirubin: 1.1 mg/dL (ref 0.2–1.2)
Total Protein: 7.4 g/dL (ref 6.0–8.3)

## 2020-12-12 MED ORDER — OMEPRAZOLE 40 MG PO CPDR: 40.0000 mg | DELAYED_RELEASE_CAPSULE | Freq: Every day | ORAL | 1 refills | Status: DC

## 2020-12-12 NOTE — Progress Notes (Signed)
Subjective:    Patient ID: Brendan Alexander, male    DOB: 03/04/1951, 70 y.o.   MRN: [id number]  Chief Complaint  Patient presents with   Results    [place] results  Patient's name pronounced " Noonqee-g".  [place] interpreter present.   HPI Patient was seen today for f/u and ultrasound results.  RUQ ultrasound normal.  Done 2/2 elevated LFTs and chronic abdominal pain due to constipation.  Pt notes improvement in abd pain and constipation since taking Miralax regularly.  Only drinking less than 1 bottle of water per day.  States feels full if tries to drink more and causes nocturia.  Inquires about medication for acid reflux.  Previously on Protonix, unsure if helping.  Also notes intermittent LE edema after eating at a restaurant with family.  Past Medical History:  Diagnosis Date   Constipation    GERD (gastroesophageal reflux disease)     No Known Allergies  ROS General: Denies fever, chills, night sweats, changes in weight, changes in appetite HEENT: Denies headaches, ear pain, changes in vision, rhinorrhea, sore throat CV: Denies CP, palpitations, SOB, orthopnea + occasional LE edema Pulm: Denies SOB, cough, wheezing GI: Denies abdominal pain, nausea, vomiting, diarrhea  +constipation GU: Denies dysuria, hematuria, frequency Msk: Denies muscle cramps, joint pains Neuro: Denies weakness, numbness, tingling Skin: Denies rashes, bruising Psych: Denies depression, anxiety, hallucinations     Objective:    Blood pressure 112/82, pulse 61, temperature 98.7 F (37.1 C), temperature source Oral, weight 152 lb (68.9 kg), SpO2 99 %.   Gen. Pleasant, well-nourished, in no distress, normal affect   HEENT: [place]/AT, face symmetric, conjunctiva clear, no scleral icterus, PERRLA, EOMI, nares patent without drainage Lungs: no accessory muscle use, CTAB, no wheezes or rales Cardiovascular: RRR, no m/r/g, no peripheral edema Abdomen: BS present, soft, NT/ND Musculoskeletal: No deformities, no  cyanosis or clubbing, normal tone Neuro:  A&Ox3, CN II-XII intact, normal gait Skin:  Warm, no lesions/ rash   Wt Readings from Last 3 Encounters:  12/12/20 152 lb (68.9 kg)  12/04/20 151 lb 1.6 oz (68.5 kg)  11/05/20 149 lb 9.6 oz (67.9 kg)    Lab Results  Component Value Date   WBC 6.7 11/05/2020   HGB 15.0 11/05/2020   HCT 45.4 11/05/2020   PLT 184.0 11/05/2020   GLUCOSE 88 11/05/2020   CHOL 178 01/19/2019   TRIG 62.0 01/19/2019   HDL 54.40 01/19/2019   LDLCALC 112 (H) 01/19/2019   ALT 59 (H) 11/05/2020   AST 49 (H) 11/05/2020   NA 139 11/05/2020   K 4.1 11/05/2020   CL 104 11/05/2020   CREATININE 1.20 11/05/2020   BUN 15 11/05/2020   CO2 28 11/05/2020   TSH 1.18 11/05/2020    Assessment/Plan:  Elevated LFTs  -AST 49 and ALT 59 on 11/05/2020 -RUQ ultrasound 12/03/2020 normal. - Plan: CMP  Chronic constipation -Improving -Encouraged to increase p.o. intake of water -Continue MiraLAX daily  Gastroesophageal reflux disease, unspecified whether esophagitis present  -Avoid foods known to cause problems - Plan: omeprazole (PRILOSEC) 40 MG capsule  Peripheral edema -Decrease sodium intake, elevate LEs, consider compression socks -Given handout  F/u as needed in 4-6 months, sooner if needed  Grier Mitts, MD

## 2020-12-17 NOTE — Progress Notes (Signed)
Results viewed on MyChart. 

## 2021-05-01 DIAGNOSIS — H2513 Age-related nuclear cataract, bilateral: Secondary | ICD-10-CM | POA: Diagnosis not present

## 2021-06-14 ENCOUNTER — Encounter: Payer: Self-pay | Admitting: Family Medicine

## 2021-06-14 ENCOUNTER — Ambulatory Visit (INDEPENDENT_AMBULATORY_CARE_PROVIDER_SITE_OTHER): Payer: Medicare Other | Admitting: Family Medicine

## 2021-06-14 VITALS — BP 120/76 | HR 62 | Temp 98.5°F | Ht 64.0 in | Wt 151.6 lb

## 2021-06-14 DIAGNOSIS — K219 Gastro-esophageal reflux disease without esophagitis: Secondary | ICD-10-CM | POA: Diagnosis not present

## 2021-06-14 DIAGNOSIS — K5909 Other constipation: Secondary | ICD-10-CM | POA: Diagnosis not present

## 2021-06-14 MED ORDER — OMEPRAZOLE 40 MG PO CPDR: 40.0000 mg | DELAYED_RELEASE_CAPSULE | Freq: Every day | ORAL | 3 refills | Status: DC

## 2021-06-14 NOTE — Patient Instructions (Addendum)
A prescription for omeprazole (Prilosec) 40 mg daily was sent to your pharmacy.  This is a medication to help with your acid reflux symptoms.  Referral for the gastroenterologist was also placed for further evaluation of your heartburn symptoms.

## 2021-06-14 NOTE — Progress Notes (Signed)
Subjective:    Patient ID: Brendan Alexander, male    DOB: Jul 17, 1950, 71 y.o.   MRN: [id number]  Chief Complaint  Patient presents with   Follow-up    Patient stated feels like heartburn in his stomach  [place] interpreter present.  HPI Patient was seen today for ongoing concern.  Pt with increased heartburn, stomach feels hard after eating.  May last 2-3 hours.  Also feels like the discomfort moves up into her chest.  Patient also notes increased saliva.  Patient has been out of Prilosec 40 mg/forgets to take the medication.  Tried an over-the-counter heartburn medication which helped symptoms.  Denies nausea, vomiting, RUQ pain.  Had colonoscopy 05/25/2019 which was normal.  Patient working on drinking more water.  May drink 1 bottle per day.  Eating spicy foods.  May have oranges regularly.  Past Medical History:  Diagnosis Date   Constipation    GERD (gastroesophageal reflux disease)     No Known Allergies  ROS General: Denies fever, chills, night sweats, changes in weight, changes in appetite HEENT: Denies headaches, ear pain, changes in vision, rhinorrhea, sore throat CV: Denies CP, palpitations, SOB, orthopnea Pulm: Denies SOB, cough, wheezing GI: Denies abdominal pain, nausea, vomiting, diarrhea  +abdominal pain, constipation  GU: Denies dysuria, hematuria, frequency Msk: Denies muscle cramps, joint pains Neuro: Denies weakness, numbness, tingling Skin: Denies rashes, bruising Psych: Denies depression, anxiety, hallucinations     Objective:    Blood pressure 120/76, pulse 62, temperature 98.5 F (36.9 C), temperature source Oral, height 5\' 4"  (1.626 m), weight 151 lb 9.6 oz (68.8 kg), SpO2 99 %.   Gen. Pleasant, well-nourished, in no distress, normal affect   HEENT: [place]/AT, face symmetric, conjunctiva clear, no scleral icterus, PERRLA, EOMI, nares patent without drainage, Lungs: no accessory muscle use Cardiovascular: RRR, no peripheral edema Abdomen: BS present, soft,  mild TTP of LUQ/epigastric area,  ND, no hepatosplenomegaly. Musculoskeletal: No deformities, no cyanosis or clubbing, normal tone Neuro:  A&Ox3, CN II-XII intact, normal gait Skin:  Warm, no lesions/ rash   Wt Readings from Last 3 Encounters:  06/14/21 151 lb 9.6 oz (68.8 kg)  12/12/20 152 lb (68.9 kg)  12/04/20 151 lb 1.6 oz (68.5 kg)    Lab Results  Component Value Date   WBC 6.7 11/05/2020   HGB 15.0 11/05/2020   HCT 45.4 11/05/2020   PLT 184.0 11/05/2020   GLUCOSE 91 12/12/2020   CHOL 178 01/19/2019   TRIG 62.0 01/19/2019   HDL 54.40 01/19/2019   LDLCALC 112 (H) 01/19/2019   ALT 70 (H) 12/12/2020   AST 49 (H) 12/12/2020   NA 139 12/12/2020   K 3.8 12/12/2020   CL 106 12/12/2020   CREATININE 1.12 12/12/2020   BUN 18 12/12/2020   CO2 26 12/12/2020   TSH 1.18 11/05/2020    Assessment/Plan:  Gastroesophageal reflux disease, unspecified whether esophagitis present -Stable without red flag symptoms -Mild increase in symptoms likely 2/2 being off PPI -Prilosec refilled -Avoid foods known to increase symptoms -Referral to GI for possible EGD.  - Plan: Ambulatory referral to Gastroenterology, omeprazole (PRILOSEC) 40 MG capsule  Chronic constipation -Stable -Discussed increasing p.o. intake of water and fiber -MiraLAX as needed  - Plan: Ambulatory referral to Gastroenterology  F/u in 3 months, sooner if needed  Grier Mitts, MD

## 2021-07-19 ENCOUNTER — Encounter: Payer: Self-pay | Admitting: Family Medicine

## 2021-09-12 ENCOUNTER — Ambulatory Visit: Payer: Medicare Other | Admitting: Family Medicine

## 2021-12-04 IMAGING — US US ABDOMEN LIMITED
1 series · 14 of 25 positions shown · non-contrast
Comparison: None.

CLINICAL DATA: Elevated LFTs. Chronic abdominal pain. Constipation.

EXAM:
ULTRASOUND ABDOMEN LIMITED RIGHT UPPER QUADRANT

[Series 1: us abdomen limited · 0.15mm/px · 14 of 48 slices shown]
[im 1/48]
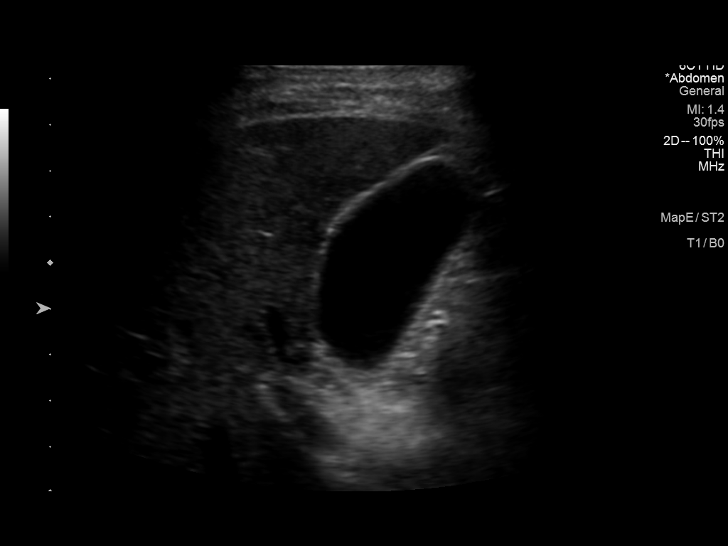
[im 4/48]
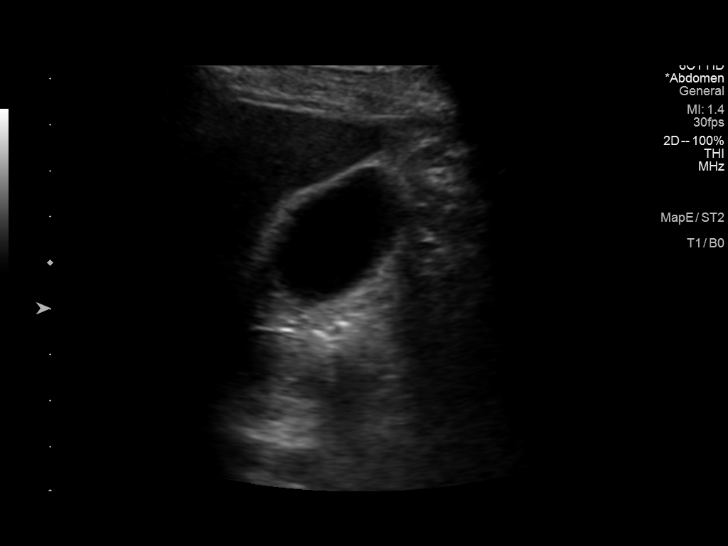
[im 8/48]
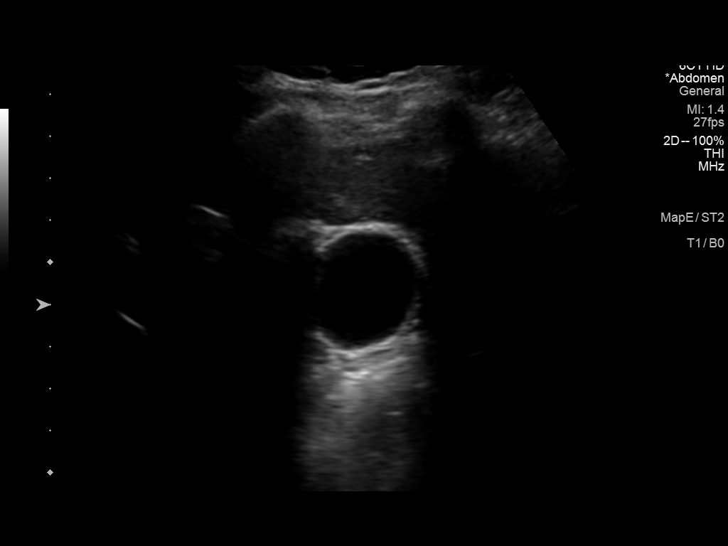
[im 12/48]
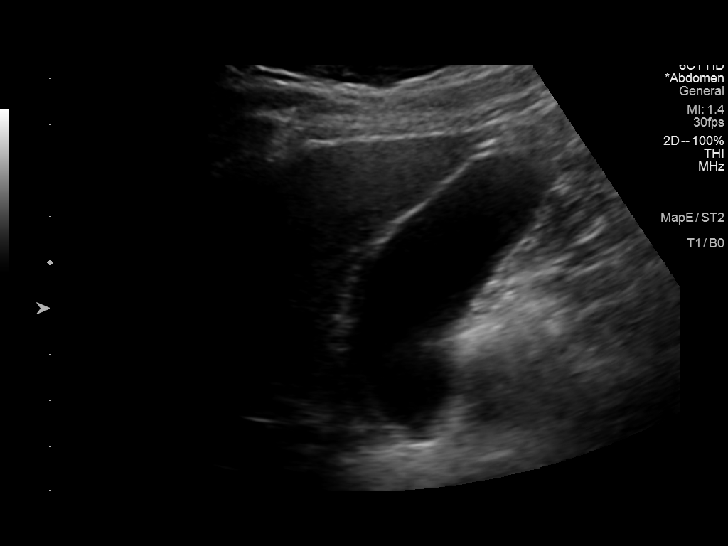
[im 16/48]
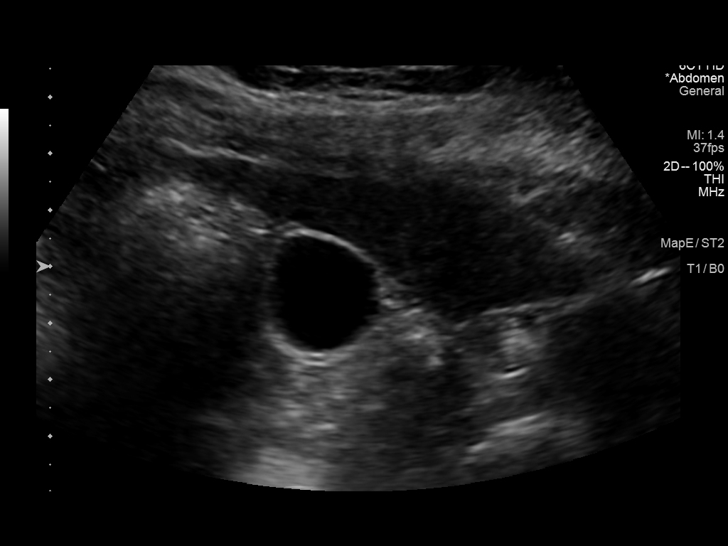
[im 18/48]
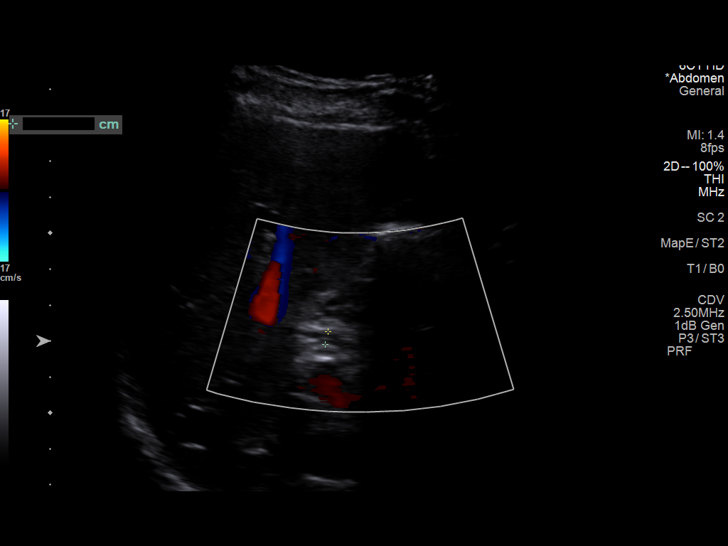
[im 22/48]
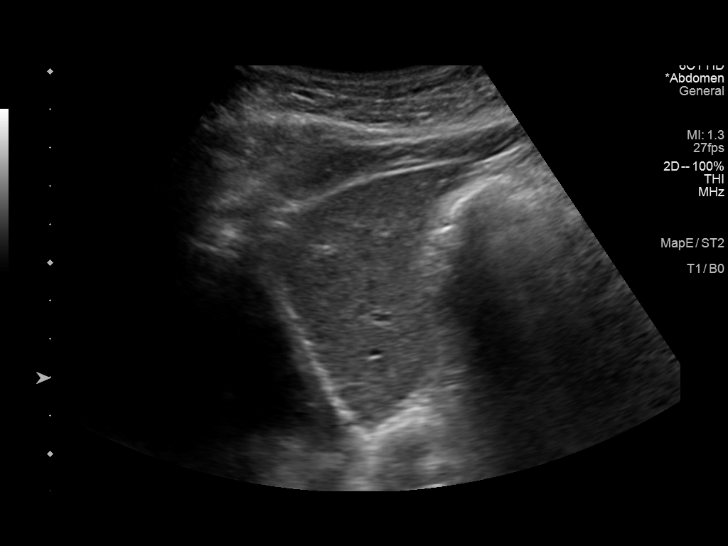
[im 26/48]
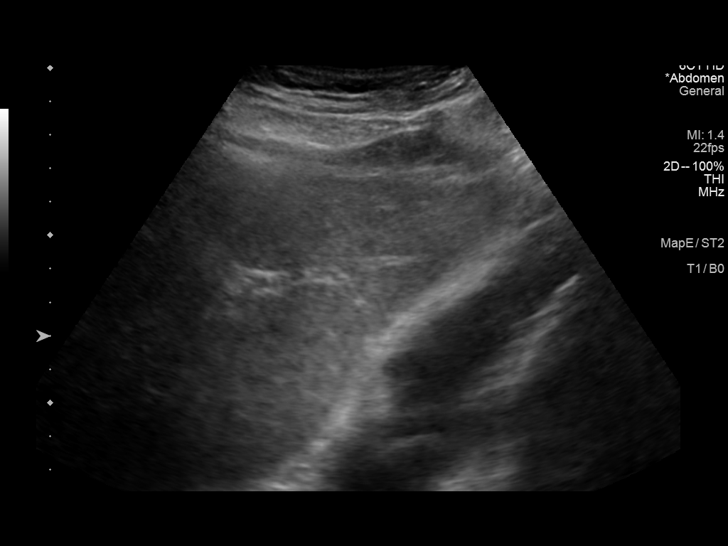
[im 30/48]
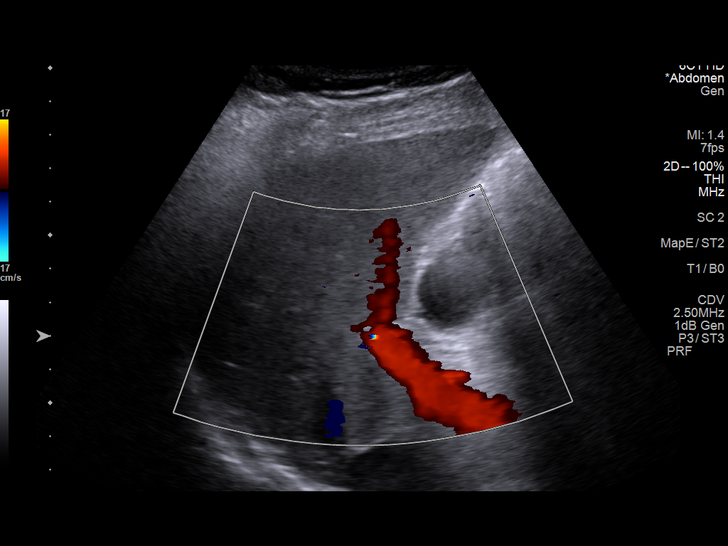
[im 32/48]
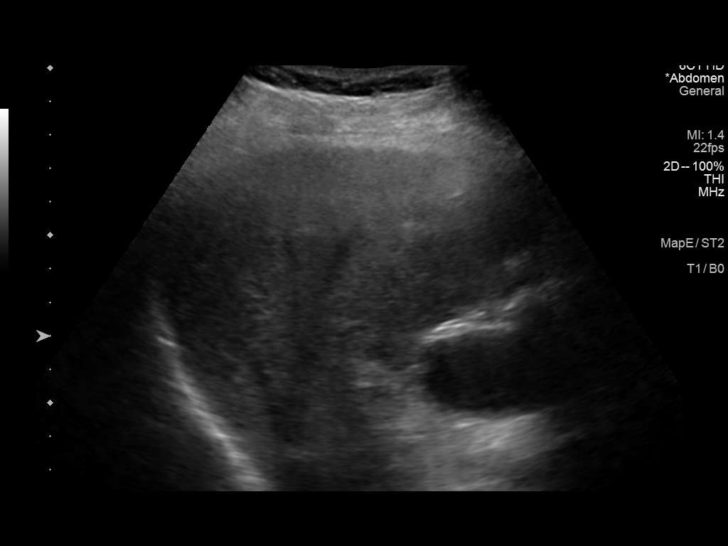
[im 36/48]
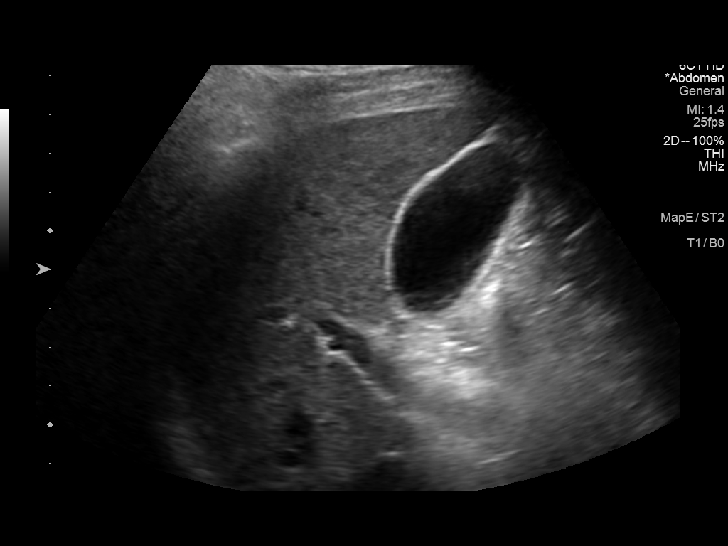
[im 40/48]
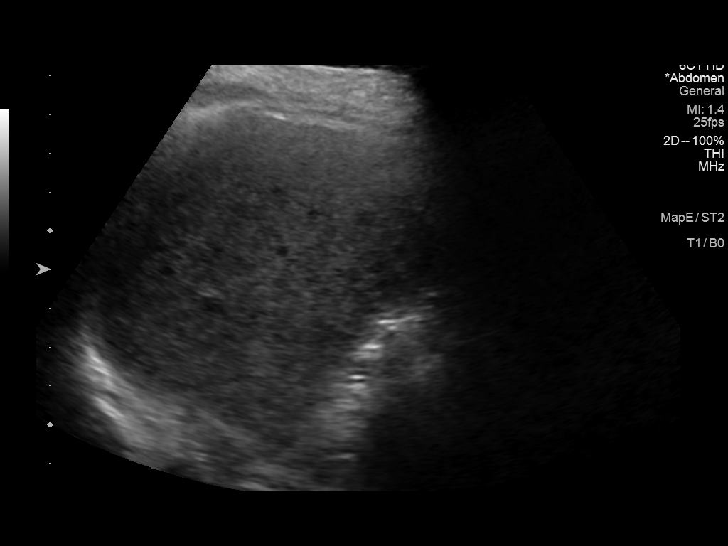
[im 44/48]
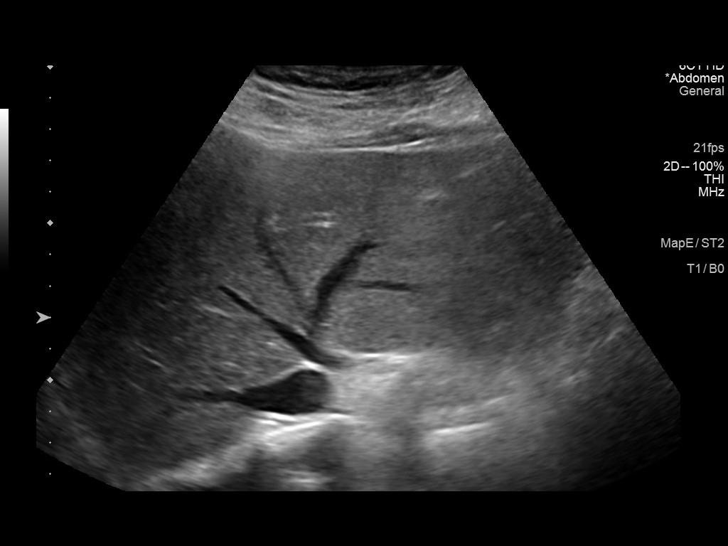
[im 48/48]
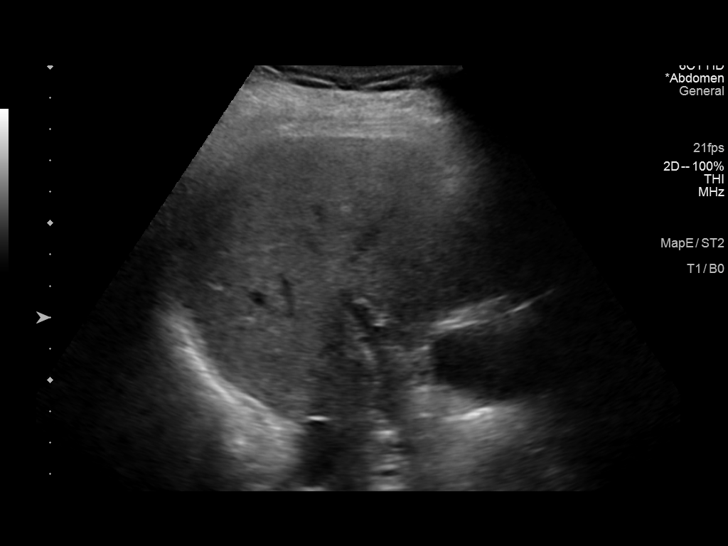

[14 of 25 positions shown; findings below may reference images not displayed]

FINDINGS: Gallbladder:

Physiologically distended. No gallstones or wall thickening
visualized. No sonographic Murphy sign noted by sonographer.

Common bile duct:

Diameter: 3-4 mm, normal.

Liver:

No focal lesion identified. Mildly heterogeneous in parenchymal
echogenicity. No capsular nodularity. Portal vein is patent on color
Doppler imaging with normal direction of blood flow towards the
liver.

Other: No right upper quadrant ascites.
IMPRESSION: 1. Mild heterogeneous hepatic echogenicity but no focal lesion.
2. Normal sonographic appearance of the gallbladder and biliary
tree.

## 2021-12-17 ENCOUNTER — Ambulatory Visit: Payer: Medicare Other

## 2022-01-07 ENCOUNTER — Ambulatory Visit (INDEPENDENT_AMBULATORY_CARE_PROVIDER_SITE_OTHER): Payer: Medicare Other

## 2022-01-07 VITALS — BP 102/70 | HR 64 | Temp 97.9°F | Ht 64.0 in | Wt 147.5 lb

## 2022-01-07 DIAGNOSIS — Z0000 Encounter for general adult medical examination without abnormal findings: Secondary | ICD-10-CM | POA: Diagnosis not present

## 2022-01-07 NOTE — Patient Instructions (Addendum)
Brendan Alexander , Thank you for taking time to come for your Medicare Wellness Visit. I appreciate your ongoing commitment to your health goals. Please review the following plan we discussed and let me know if I can assist you in the future.   Screening recommendations/referrals: Colonoscopy: not required Recommended yearly ophthalmology/optometry visit for glaucoma screening and checkup Recommended yearly dental visit for hygiene and checkup  Vaccinations: Influenza vaccine: decline Pneumococcal vaccine: decline Tdap vaccine: due Shingles vaccine: decline   Covid-19: will bring in card  Advanced directives: copy in chart  Conditions/risks identified: none  Next appointment: Follow up in one year for your annual wellness visit.   Preventive Care 72 Years and Older, Male Preventive care refers to lifestyle choices and visits with your health care provider that can promote health and wellness. What does preventive care include? A yearly physical exam. This is also called an annual well check. Dental exams once or twice a year. Routine eye exams. Ask your health care provider how often you should have your eyes checked. Personal lifestyle choices, including: Daily care of your teeth and gums. Regular physical activity. Eating a healthy diet. Avoiding tobacco and drug use. Limiting alcohol use. Practicing safe sex. Taking low doses of aspirin every day. Taking vitamin and mineral supplements as recommended by your health care provider. What happens during an annual well check? The services and screenings done by your health care provider during your annual well check will depend on your age, overall health, lifestyle risk factors, and family history of disease. Counseling  Your health care provider may ask you questions about your: Alcohol use. Tobacco use. Drug use. Emotional well-being. Home and relationship well-being. Sexual activity. Eating habits. History of falls. Memory  and ability to understand (cognition). Work and work Statistician. Screening  You may have the following tests or measurements: Height, weight, and BMI. Blood pressure. Lipid and cholesterol levels. These may be checked every 5 years, or more frequently if you are over 26 years old. Skin check. Lung cancer screening. You may have this screening every year starting at age 72 if you have a 30-pack-year history of smoking and currently smoke or have quit within the past 15 years. Fecal occult blood test (FOBT) of the stool. You may have this test every year starting at age 72. Flexible sigmoidoscopy or colonoscopy. You may have a sigmoidoscopy every 5 years or a colonoscopy every 10 years starting at age 72. Prostate cancer screening. Recommendations will vary depending on your family history and other risks. Hepatitis C blood test. Hepatitis B blood test. Sexually transmitted disease (STD) testing. Diabetes screening. This is done by checking your blood sugar (glucose) after you have not eaten for a while (fasting). You may have this done every 1-3 years. Abdominal aortic aneurysm (AAA) screening. You may need this if you are a current or former smoker. Osteoporosis. You may be screened starting at age 72 if you are at high risk. Talk with your health care provider about your test results, treatment options, and if necessary, the need for more tests. Vaccines  Your health care provider may recommend certain vaccines, such as: Influenza vaccine. This is recommended every year. Tetanus, diphtheria, and acellular pertussis (Tdap, Td) vaccine. You may need a Td booster every 10 years. Zoster vaccine. You may need this after age 72. Pneumococcal 13-valent conjugate (PCV13) vaccine. One dose is recommended after age 81. Pneumococcal polysaccharide (PPSV23) vaccine. One dose is recommended after age 72. Talk to your health care provider  about which screenings and vaccines you need and how often you  need them. This information is not intended to replace advice given to you by your health care provider. Make sure you discuss any questions you have with your health care provider. Document Released: 06/01/2015 Document Revised: 01/23/2016 Document Reviewed: 03/06/2015 Elsevier Interactive Patient Education  2017 [employer] Prevention in the Home Falls can cause injuries. They can happen to people of all ages. There are many things you can do to make your home safe and to help prevent falls. What can I do on the outside of my home? Regularly fix the edges of walkways and driveways and fix any cracks. Remove anything that might make you trip as you walk through a door, such as a raised step or threshold. Trim any bushes or trees on the path to your home. Use bright outdoor lighting. Clear any walking paths of anything that might make someone trip, such as rocks or tools. Regularly check to see if handrails are loose or broken. Make sure that both sides of any steps have handrails. Any raised decks and porches should have guardrails on the edges. Have any leaves, snow, or ice cleared regularly. Use sand or salt on walking paths during winter. Clean up any spills in your garage right away. This includes oil or grease spills. What can I do in the bathroom? Use night lights. Install grab bars by the toilet and in the tub and shower. Do not use towel bars as grab bars. Use non-skid mats or decals in the tub or shower. If you need to sit down in the shower, use a plastic, non-slip stool. Keep the floor dry. Clean up any water that spills on the floor as soon as it happens. Remove soap buildup in the tub or shower regularly. Attach bath mats securely with double-sided non-slip rug tape. Do not have throw rugs and other things on the floor that can make you trip. What can I do in the bedroom? Use night lights. Make sure that you have a light by your bed that is easy to reach. Do not  use any sheets or blankets that are too big for your bed. They should not hang down onto the floor. Have a firm chair that has side arms. You can use this for support while you get dressed. Do not have throw rugs and other things on the floor that can make you trip. What can I do in the kitchen? Clean up any spills right away. Avoid walking on wet floors. Keep items that you use a lot in easy-to-reach places. If you need to reach something above you, use a strong step stool that has a grab bar. Keep electrical cords out of the way. Do not use floor polish or wax that makes floors slippery. If you must use wax, use non-skid floor wax. Do not have throw rugs and other things on the floor that can make you trip. What can I do with my stairs? Do not leave any items on the stairs. Make sure that there are handrails on both sides of the stairs and use them. Fix handrails that are broken or loose. Make sure that handrails are as long as the stairways. Check any carpeting to make sure that it is firmly attached to the stairs. Fix any carpet that is loose or worn. Avoid having throw rugs at the top or bottom of the stairs. If you do have throw rugs, attach them to the floor  with carpet tape. Make sure that you have a light switch at the top of the stairs and the bottom of the stairs. If you do not have them, ask someone to add them for you. What else can I do to help prevent falls? Wear shoes that: Do not have high heels. Have rubber bottoms. Are comfortable and fit you well. Are closed at the toe. Do not wear sandals. If you use a stepladder: Make sure that it is fully opened. Do not climb a closed stepladder. Make sure that both sides of the stepladder are locked into place. Ask someone to hold it for you, if possible. Clearly mark and make sure that you can see: Any grab bars or handrails. First and last steps. Where the edge of each step is. Use tools that help you move around (mobility  aids) if they are needed. These include: Canes. Walkers. Scooters. Crutches. Turn on the lights when you go into a dark area. Replace any light bulbs as soon as they burn out. Set up your furniture so you have a clear path. Avoid moving your furniture around. If any of your floors are uneven, fix them. If there are any pets around you, be aware of where they are. Review your medicines with your doctor. Some medicines can make you feel dizzy. This can increase your chance of falling. Ask your doctor what other things that you can do to help prevent falls. This information is not intended to replace advice given to you by your health care provider. Make sure you discuss any questions you have with your health care provider. Document Released: 03/01/2009 Document Revised: 10/11/2015 Document Reviewed: 06/09/2014 Elsevier Interactive Patient Education  2017 [employer].

## 2022-01-07 NOTE — Progress Notes (Signed)
Subjective:   Brendan Alexander is a 71 y.o. male who presents for Medicare Annual/Subsequent preventive examination.  Review of Systems     Cardiac Risk Factors include: advanced age (>71mn, >>58women)     Objective:    Today's Vitals   01/07/22 1335  BP: 102/70  Pulse: 64  Temp: 97.9 F (36.6 C)  TempSrc: Oral  Weight: 147 lb 8 oz (66.9 kg)  Height: '5\' 4"'$  (1.626 m)   Body mass index is 25.32 kg/m.     01/07/2022    1:48 PM  Advanced Directives  Does Patient Have a Medical Advance Directive? Yes  Type of AParamedicof [place] will  [place] of [place]? Yes - validated most recent copy scanned in chart (See row information)    Current Medications (verified) Outpatient Encounter Medications as of 01/07/2022  Medication Sig   omeprazole (PRILOSEC) 40 MG capsule Take 1 capsule (40 mg total) by mouth daily.   cyclobenzaprine (FLEXERIL) 5 MG tablet Take 1 tablet (5 mg total) by mouth at bedtime as needed for muscle spasms. (Patient not taking: Reported on 01/07/2022)   polyethylene glycol powder (GLYCOLAX/MIRALAX) 17 GM/SCOOP powder Take 17 g by mouth 2 (two) times daily as needed. (Patient not taking: Reported on 01/07/2022)   No facility-administered encounter medications on file as of 01/07/2022.    Allergies (verified) Patient has no known allergies.   History: Past Medical History:  Diagnosis Date   Constipation    GERD (gastroesophageal reflux disease)    Past Surgical History:  Procedure Laterality Date   HAND SURGERY     Family History  Problem Relation Age of Onset   Colon cancer Neg Hx    Colon polyps Neg Hx    Esophageal cancer Neg Hx    Rectal cancer Neg Hx    Stomach cancer Neg Hx    Social History   Socioeconomic History   Marital status: Married    Spouse name: Not on file   Number of children: 4   Years of education: 10   Highest education level: Not on file  Occupational History    Not on file  Tobacco Use   Smoking status: Former    Packs/day: 0.50    Years: 45.00    Total pack years: 22.50    Types: Cigarettes   Smokeless tobacco: Never  Vaping Use   Vaping Use: Never used  Substance and Sexual Activity   Alcohol use: Yes    Alcohol/week: 0.0 standard drinks of alcohol    Comment: socially   Drug use: No   Sexual activity: Not on file  Other Topics Concern   Not on file  Social History Narrative   Fun: Read, garden   Denies religious beliefs effecting health care.    Social Determinants of Health   Financial Resource Strain: Low Risk  (01/07/2022)   Overall Financial Resource Strain (CARDIA)    Difficulty of Paying Living Expenses: Not hard at all  Food Insecurity: No Food Insecurity (01/07/2022)   Hunger Vital Sign    Worried About Running Out of Food in the Last Year: Never true    Ran Out of Food in the Last Year: Never true  Transportation Needs: No Transportation Needs (01/07/2022)   PRAPARE - THydrologist(Medical): No    Lack of Transportation (Non-Medical): No  Physical Activity: Inactive (01/07/2022)   Exercise Vital Sign    Days of Exercise  per Week: 0 days    Minutes of Exercise per Session: 0 min  Stress: No Stress Concern Present (01/07/2022)   [place]    Feeling of Stress : Not at all  Social Connections: Moderately Integrated (12/04/2020)   Social Connection and Isolation Panel [NHANES]    Frequency of Communication with Friends and Family: Twice a week    Frequency of Social Gatherings with Friends and Family: Three times a week    Attends Religious Services: 1 to 4 times per year    Active Member of Clubs or Organizations: No    Attends Archivist Meetings: Never    Marital Status: Married    Tobacco Counseling Counseling given: Not Answered   Clinical Intake:  Pre-visit preparation completed: Yes  Pain : No/denies  pain     Nutritional Status: BMI 25 -29 Overweight Nutritional Risks: None Diabetes: No  How often do you need to have someone help you when you read instructions, pamphlets, or other written materials from your doctor or pharmacy?: 4 - Often  Diabetic? no  Interpreter Needed?: Yes Interpreter Agency: [facility] Interpreter Name: Daryl Eastern  Information entered by :: NAllen LPN   Activities of Daily Living    01/07/2022    1:50 PM  In your present state of health, do you have any difficulty performing the following activities:  Hearing? 0  Vision? 1  Comment trouble seeing at night and raining  Difficulty concentrating or making decisions? 0  Walking or climbing stairs? 0  Dressing or bathing? 0  Doing errands, shopping? 0  Preparing Food and eating ? N  Using the Toilet? N  In the past six months, have you accidently leaked urine? N  Do you have problems with loss of bowel control? N  Managing your Medications? N  Managing your Finances? N  Housekeeping or managing your Housekeeping? N    Patient Care Team: Billie Ruddy, MD as PCP - General (Family Medicine)  Indicate any recent Medical Services you may have received from other than [facility] providers in the past year (date may be approximate).     Assessment:   This is a routine wellness examination for Brendan Alexander.  Hearing/Vision screen Vision Screening - Comments:: No regular eye exams,   Dietary issues and exercise activities discussed: Current Exercise Habits: The patient does not participate in regular exercise at present   Goals Addressed             This Visit's Progress    Patient Stated       01/07/2022, stay healthy       Depression Screen    01/07/2022    1:50 PM 12/04/2020    1:54 PM  PHQ 2/9 Scores  PHQ - 2 Score 0 0    Fall Risk    01/07/2022    1:50 PM 12/04/2020    1:57 PM  [place] in the past year? 0 0  Number falls in past yr: 0 0  Injury with Fall? 0 0  Risk for fall due  to : Medication side effect Impaired vision  Follow up Falls evaluation completed;Education provided;Falls prevention discussed Falls prevention discussed    FALL RISK PREVENTION PERTAINING TO THE HOME:  Any stairs in or around the home? Yes  If so, are there any without handrails? No  Home free of loose throw rugs in walkways, pet beds, electrical cords, etc? Yes  Adequate lighting in your  home to reduce risk of falls? Yes   ASSISTIVE DEVICES UTILIZED TO PREVENT FALLS:  Life alert? No  Use of a cane, walker or w/c? No  Grab bars in the bathroom? No  Shower chair or bench in shower? No  Elevated toilet seat or a handicapped toilet? No   TIMED UP AND GO:  Was the test performed? No .    Gait steady and fast without use of assistive device  Cognitive Function:        12/04/2020    2:04 PM  6CIT Screen  What Year? 0 points  What month? 0 points  What time? 0 points  Count back from 20 0 points  Months in reverse 4 points  Repeat phrase 10 points  Total Score 14 points    Immunizations  There is no immunization history on file for this patient.  TDAP status: Due, Education has been provided regarding the importance of this vaccine. Advised may receive this vaccine at local pharmacy or Health Dept. Aware to provide a copy of the vaccination record if obtained from local pharmacy or Health Dept. Verbalized acceptance and understanding.  Flu Vaccine status: Declined, Education has been provided regarding the importance of this vaccine but patient still declined. Advised may receive this vaccine at local pharmacy or Health Dept. Aware to provide a copy of the vaccination record if obtained from local pharmacy or Health Dept. Verbalized acceptance and understanding.  Pneumococcal vaccine status: Declined,  Education has been provided regarding the importance of this vaccine but patient still declined. Advised may receive this vaccine at local pharmacy or Health Dept. Aware to  provide a copy of the vaccination record if obtained from local pharmacy or Health Dept. Verbalized acceptance and understanding.   Covid-19 vaccine status: Completed vaccines  Qualifies for Shingles Vaccine? Yes   Zostavax completed No   Shingrix Completed?: No.    Education has been provided regarding the importance of this vaccine. Patient has been advised to call insurance company to determine out of pocket expense if they have not yet received this vaccine. Advised may also receive vaccine at local pharmacy or Health Dept. Verbalized acceptance and understanding.  Screening Tests Health Maintenance  Topic Date Due   COVID-19 Vaccine (1) Never done   Hepatitis C Screening  Never done   TETANUS/TDAP  Never done   Zoster Vaccines- Shingrix (1 of 2) Never done   Pneumonia Vaccine 45+ Years old (1 - PCV) Never done   INFLUENZA VACCINE  12/17/2021   HPV VACCINES  Aged Out   COLONOSCOPY (Pts 45-41yr Insurance coverage will need to be confirmed)  Discontinued    Health Maintenance  Health Maintenance Due  Topic Date Due   COVID-19 Vaccine (1) Never done   Hepatitis C Screening  Never done   TETANUS/TDAP  Never done   Zoster Vaccines- Shingrix (1 of 2) Never done   Pneumonia Vaccine 72 Years old (1 - PCV) Never done   INFLUENZA VACCINE  12/17/2021    Colorectal cancer screening: No longer required.   Lung Cancer Screening: (Low Dose CT Chest recommended if Age 322-80years, 30 pack-year currently smoking OR have quit w/in 15years.) does not qualify.   Lung Cancer Screening Referral: no  Additional Screening:  Hepatitis C Screening: does qualify;   Vision Screening: Recommended annual ophthalmology exams for early detection of glaucoma and other disorders of the eye. Is the patient up to date with their annual eye exam?  No  Who is the  provider or what is the name of the office in which the patient attends annual eye exams? no If pt is not established with a provider, would  they like to be referred to a provider to establish care? No .   Dental Screening: Recommended annual dental exams for proper oral hygiene  Community Resource Referral / Chronic Care Management: CRR required this visit?  No   CCM required this visit?  No      Plan:     I have personally reviewed and noted the following in the patient's chart:   Medical and social history Use of alcohol, tobacco or illicit drugs  Current medications and supplements including opioid prescriptions. Patient is not currently taking opioid prescriptions. Functional ability and status Nutritional status Physical activity Advanced directives List of other physicians Hospitalizations, surgeries, and ER visits in previous 12 months Vitals Screenings to include cognitive, depression, and falls Referrals and appointments  In addition, I have reviewed and discussed with patient certain preventive protocols, quality metrics, and best practice recommendations. A written personalized care plan for preventive services as well as general preventive health recommendations were provided to patient.     Kellie Simmering, LPN   [id number]   Nurse Notes: 6 CIT not administered due to language barrier. Patient appeared to have normal cognition per direct observation.

## 2022-10-27 ENCOUNTER — Ambulatory Visit: Payer: Medicare Other | Admitting: Internal Medicine

## 2022-11-10 ENCOUNTER — Ambulatory Visit (INDEPENDENT_AMBULATORY_CARE_PROVIDER_SITE_OTHER): Payer: Medicare Other | Admitting: Internal Medicine

## 2022-11-10 ENCOUNTER — Ambulatory Visit (INDEPENDENT_AMBULATORY_CARE_PROVIDER_SITE_OTHER): Payer: Medicare Other

## 2022-11-10 ENCOUNTER — Encounter: Payer: Self-pay | Admitting: Internal Medicine

## 2022-11-10 VITALS — BP 120/82 | HR 50 | Temp 98.0°F | Ht 64.0 in | Wt 146.0 lb

## 2022-11-10 DIAGNOSIS — M25512 Pain in left shoulder: Secondary | ICD-10-CM | POA: Diagnosis not present

## 2022-11-10 DIAGNOSIS — Z23 Encounter for immunization: Secondary | ICD-10-CM

## 2022-11-10 DIAGNOSIS — R1012 Left upper quadrant pain: Secondary | ICD-10-CM | POA: Diagnosis not present

## 2022-11-10 DIAGNOSIS — E559 Vitamin D deficiency, unspecified: Secondary | ICD-10-CM

## 2022-11-10 DIAGNOSIS — E538 Deficiency of other specified B group vitamins: Secondary | ICD-10-CM | POA: Diagnosis not present

## 2022-11-10 DIAGNOSIS — R109 Unspecified abdominal pain: Secondary | ICD-10-CM | POA: Diagnosis not present

## 2022-11-10 DIAGNOSIS — K219 Gastro-esophageal reflux disease without esophagitis: Secondary | ICD-10-CM

## 2022-11-10 DIAGNOSIS — E7800 Pure hypercholesterolemia, unspecified: Secondary | ICD-10-CM | POA: Diagnosis not present

## 2022-11-10 DIAGNOSIS — R739 Hyperglycemia, unspecified: Secondary | ICD-10-CM

## 2022-11-10 DIAGNOSIS — Z125 Encounter for screening for malignant neoplasm of prostate: Secondary | ICD-10-CM | POA: Diagnosis not present

## 2022-11-10 DIAGNOSIS — F17200 Nicotine dependence, unspecified, uncomplicated: Secondary | ICD-10-CM | POA: Diagnosis not present

## 2022-11-10 DIAGNOSIS — Z122 Encounter for screening for malignant neoplasm of respiratory organs: Secondary | ICD-10-CM

## 2022-11-10 DIAGNOSIS — N320 Bladder-neck obstruction: Secondary | ICD-10-CM

## 2022-11-10 LAB — LIPID PANEL
Cholesterol: 172 mg/dL (ref 0–200)
HDL: 53.7 mg/dL (ref >39.00)
LDL Cholesterol: 102 mg/dL — ABNORMAL HIGH (ref 0–99)
NonHDL: 118.04
Total CHOL/HDL Ratio: 3
Triglycerides: 81 mg/dL (ref 0.0–149.0)
VLDL: 16.2 mg/dL (ref 0.0–40.0)

## 2022-11-10 LAB — HEPATIC FUNCTION PANEL
ALT: 142 U/L — ABNORMAL HIGH (ref 0–53)
AST: 101 U/L — ABNORMAL HIGH (ref 0–37)
Albumin: 4.4 g/dL (ref 3.5–5.2)
Alkaline Phosphatase: 72 U/L (ref 39–117)
Bilirubin, Direct: 0.2 mg/dL (ref 0.0–0.3)
Total Bilirubin: 0.8 mg/dL (ref 0.2–1.2)
Total Protein: 8.1 g/dL (ref 6.0–8.3)

## 2022-11-10 LAB — VITAMIN D 25 HYDROXY (VIT D DEFICIENCY, FRACTURES): VITD: 29.05 ng/mL — ABNORMAL LOW (ref 30.00–100.00)

## 2022-11-10 LAB — PSA: PSA: 1.18 ng/mL (ref 0.10–4.00)

## 2022-11-10 LAB — CBC WITH DIFFERENTIAL/PLATELET
Basophils Absolute: 0 10*3/uL (ref 0.0–0.1)
Basophils Relative: 0.8 % (ref 0.0–3.0)
Eosinophils Absolute: 0.3 10*3/uL (ref 0.0–0.7)
Eosinophils Relative: 5.1 % — ABNORMAL HIGH (ref 0.0–5.0)
HCT: 46.6 % (ref 39.0–52.0)
Hemoglobin: 14.9 g/dL (ref 13.0–17.0)
Lymphocytes Relative: 34.5 % (ref 12.0–46.0)
Lymphs Abs: 1.8 10*3/uL (ref 0.7–4.0)
MCHC: 32.1 g/dL (ref 30.0–36.0)
MCV: 87.7 fl (ref 78.0–100.0)
Monocytes Absolute: 0.4 10*3/uL (ref 0.1–1.0)
Monocytes Relative: 8.3 % (ref 3.0–12.0)
Neutro Abs: 2.7 10*3/uL (ref 1.4–7.7)
Neutrophils Relative %: 51.3 % (ref 43.0–77.0)
Platelets: 173 10*3/uL (ref 150.0–400.0)
RBC: 5.31 Mil/uL (ref 4.22–5.81)
RDW: 13.9 % (ref 11.5–15.5)
WBC: 5.2 10*3/uL (ref 4.0–10.5)

## 2022-11-10 LAB — TSH: TSH: 1.65 u[IU]/mL (ref 0.35–5.50)

## 2022-11-10 LAB — URINALYSIS, ROUTINE W REFLEX MICROSCOPIC
Bilirubin Urine: NEGATIVE
Hgb urine dipstick: NEGATIVE
Ketones, ur: NEGATIVE
Leukocytes,Ua: NEGATIVE
Nitrite: NEGATIVE
RBC / HPF: NONE SEEN (ref 0–?)
Specific Gravity, Urine: 1.02 (ref 1.000–1.030)
Total Protein, Urine: NEGATIVE
Urine Glucose: NEGATIVE
Urobilinogen, UA: 1 (ref 0.0–1.0)
WBC, UA: NONE SEEN (ref 0–?)
pH: 6.5 (ref 5.0–8.0)

## 2022-11-10 LAB — VITAMIN B12: Vitamin B-12: 525 pg/mL (ref 211–911)

## 2022-11-10 LAB — HEMOGLOBIN A1C: Hgb A1c MFr Bld: 5.5 % (ref 4.6–6.5)

## 2022-11-10 NOTE — Patient Instructions (Addendum)
Please have your Shingrix (shingles) shots done at your local pharmacy., as well as the Tdap Tetanus shot   You have the Prevnar 20 pneumonia shot today  Please quit smoking  Please continue all other medications as before, and refills have been done if requested.  Please have the pharmacy call with any other refills you may need.  Please continue your efforts at being more active, low cholesterol diet, and weight control.  You are otherwise up to date with prevention measures today.  Please keep your appointments with your specialists as you may have planned  You will be contacted regarding the referral for: Pulmonary for the CT chest lung cancer screening program  You will be contacted regarding the referral for: CT scan for the abdominal  You will be contacted regarding the referral for: Sports Medicine for the left shoulder and arm  Please go to the XRAY Department in the first floor for the x-ray testing  Please go to the LAB at the blood drawing area for the tests to be done  You will be contacted by phone if any changes need to be made immediately.  Otherwise, you will receive a letter about your results with an explanation, but please check with MyChart first.  Please remember to sign up for MyChart if you have not done so, as this will be important to you in the future with finding out test results, communicating by private email, and scheduling acute appointments online when needed.  Please make an Appointment to return in 3 months, or sooner if needed

## 2022-11-10 NOTE — Progress Notes (Unsigned)
Patient ID: Brendan Alexander, male   DOB: [birth year], 72 y.o.   MRN: [id number]        Chief Complaint: follow up smoker, left upper arm pain, left flank pain       HPI:  Brendan Alexander is a 72 y.o. male here overall ok but has several concerns.  Still smoking after 49 yrs.  He is ok for LDCT screening referral.  Due for LDCT screening  does have several wks of mild to mod left upper arm pain hard to localize, sometimes with weakness and has difficulty with lifting usual item at times.  Cannot endorse neck pain, elbow pain, but may have some soreness to abduct as well, and maybe even slight decreased ROM.  In addition, has mild diffuse intermittent abd pain, mostly left sided but seems worse at the left flank , dull burning, sometimes sore to touch but Denies urinary symptoms such as dysuria, frequency, urgency, hematuria or n/v, fever, chills.  Denies worsening reflux, dysphagia, n/v, bowel change or blood.    Wt Readings from Last 3 Encounters:  11/12/22 148 lb (67.1 kg)  11/10/22 146 lb (66.2 kg)  01/07/22 147 lb 8 oz (66.9 kg)   BP Readings from Last 3 Encounters:  11/12/22 128/84  11/10/22 120/82  01/07/22 102/70         Past Medical History:  Diagnosis Date   Constipation    GERD (gastroesophageal reflux disease)    Past Surgical History:  Procedure Laterality Date   HAND SURGERY      reports that he has quit smoking. His smoking use included cigarettes. He has a 22.50 pack-year smoking history. He has never used smokeless tobacco. He reports current alcohol use. He reports that he does not use drugs. family history is not on file. No Known Allergies Current Outpatient Medications on File Prior to Visit  Medication Sig Dispense Refill   omeprazole (PRILOSEC) 40 MG capsule Take 1 capsule (40 mg total) by mouth daily. 90 capsule 3   polyethylene glycol powder (GLYCOLAX/MIRALAX) 17 GM/SCOOP powder Take 17 g by mouth 2 (two) times daily as needed. (Patient not taking: Reported on  01/07/2022) 3350 g 1   No current facility-administered medications on file prior to visit.        ROS:  All others reviewed and negative.  Objective        PE:  BP 120/82 (BP Location: Right Arm, Patient Position: Sitting, Cuff Size: Normal)   Pulse (!) 50   Temp 98 F (36.7 C) (Oral)   Ht 5\' 4"  (1.626 m)   Wt 146 lb (66.2 kg)   SpO2 99%   BMI 25.06 kg/m                 Constitutional: Pt appears in NAD               HENT: Head: NCAT.                Right Ear: External ear normal.                 Left Ear: External ear normal.                Eyes: . Pupils are equal, round, and reactive to light. Conjunctivae and EOM are normal               Nose: without d/c or deformity               Neck: Neck  supple. Gross normal ROM               Cardiovascular: Normal rate and regular rhythm.                 Pulmonary/Chest: Effort normal and breath sounds without rales or wheezing.                Abd:  Soft, NT, ND, + BS, no organomegaly, non flank tender or rash               Neurological: Pt is alert. At baseline orientation, motor grossly intact               Skin: Skin is warm. No rashes, no other new lesions, LE edema - none               Psychiatric: Pt behavior is normal without agitation   Micro: none  Cardiac tracings I have personally interpreted today:  none  Pertinent Radiological findings (summarize): none   Lab Results  Component Value Date   WBC 5.2 11/10/2022   HGB 14.9 11/10/2022   HCT 46.6 11/10/2022   PLT 173.0 11/10/2022   GLUCOSE 86 11/10/2022   CHOL 172 11/10/2022   TRIG 81.0 11/10/2022   HDL 53.70 11/10/2022   LDLCALC 102 (H) 11/10/2022   ALT 142 (H) 11/10/2022   AST 101 (H) 11/10/2022   NA 141 11/10/2022   K 4.9 11/10/2022   CL 105 11/10/2022   CREATININE 1.11 11/10/2022   BUN 18 11/10/2022   CO2 29 11/10/2022   TSH 1.65 11/10/2022   PSA 1.18 11/10/2022   HGBA1C 5.5 11/10/2022   Assessment/Plan:  Brendan Alexander is a 72 y.o. Asian [4] male with   has a past medical history of Constipation and GERD (gastroesophageal reflux disease).  Gastroesophageal reflux disease Stable overall, cont prilosec 40 mg qd  Left shoulder pain Mild to mod intermittent, etiology unclear, for referral Sports Medicine  Smoker Pt counsled to quit, pt not ready  Left flank pain Etiology unclear, can't r/o renal stone vs other, for lab including cbc, ua and CT renal  Followup: Return in about 3 months (around 02/10/2023).  Oliver Barre, MD 11/13/2022 8:21 PM [facility]

## 2022-11-11 ENCOUNTER — Other Ambulatory Visit: Payer: Self-pay | Admitting: Internal Medicine

## 2022-11-11 DIAGNOSIS — R7989 Other specified abnormal findings of blood chemistry: Secondary | ICD-10-CM

## 2022-11-11 LAB — BASIC METABOLIC PANEL
BUN: 18 mg/dL (ref 6–23)
CO2: 29 mEq/L (ref 19–32)
Calcium: 10.1 mg/dL (ref 8.4–10.5)
Chloride: 105 mEq/L (ref 96–112)
Creatinine, Ser: 1.11 mg/dL (ref 0.40–1.50)
GFR: 66.01 mL/min (ref >60.00)
Glucose, Bld: 86 mg/dL (ref 70–99)
Potassium: 4.9 mEq/L (ref 3.5–5.1)
Sodium: 141 mEq/L (ref 135–145)

## 2022-11-11 LAB — LIPASE: Lipase: 47 U/L (ref 11.0–59.0)

## 2022-11-12 ENCOUNTER — Other Ambulatory Visit: Payer: Self-pay

## 2022-11-12 ENCOUNTER — Ambulatory Visit (INDEPENDENT_AMBULATORY_CARE_PROVIDER_SITE_OTHER): Payer: Medicare Other | Admitting: Family Medicine

## 2022-11-12 VITALS — BP 128/84 | HR 54 | Ht 64.0 in | Wt 148.0 lb

## 2022-11-12 DIAGNOSIS — M25512 Pain in left shoulder: Secondary | ICD-10-CM | POA: Diagnosis not present

## 2022-11-12 DIAGNOSIS — G8929 Other chronic pain: Secondary | ICD-10-CM | POA: Diagnosis not present

## 2022-11-12 MED ORDER — MELOXICAM 15 MG PO TABS: ORAL_TABLET | ORAL | 3 refills | Status: DC

## 2022-11-12 NOTE — Progress Notes (Signed)
   Rubin Payor, PhD, LAT, ATC acting as a scribe for Clementeen Graham, MD.  Brendan Alexander is a 72 y.o. male who presents to [facility] at [facility] today for L shoulder pain x a few months. He recalls an injury when he lifted a heavy objection. Pt locates pain to all over the L shoulder joint.   Neck pain: yes- slight Radiates: yes UE numbness/tingling: yes- whole L arm UE weakness: yes Aggravates:  picking up heavy objects Treatments tried: no  Pertinent review of systems: No fevers or chills  Relevant historical information: GERD   Exam:  BP 128/84   Pulse (!) 54   Ht 5\' 4"  (1.626 m)   Wt 148 lb (67.1 kg)   SpO2 99%   BMI 25.40 kg/m  General: Well Developed, well nourished, and in no acute distress.   MSK: Left shoulder: Normal-appearing Nontender. Normal motion.  Pain with abduction. Intact strength.  Pain with resisted abduction. Positive Hawkins and Neer's test.  Positive empty can test. Negative Yergason's and speeds test.    Lab and Radiology Results  Diagnostic Limited MSK Ultrasound of: Left shoulder Biceps tendon intact normal-appearing Subscapularis tendon is intact Supraspinatus tendon is intact with mild subacromial bursitis. Infraspinatus tendon is intact. AC joint degenerative. Impression: Subacromial bursitis      Assessment and Plan: 72 y.o. male with left shoulder pain thought to be due to subacromial bursitis and impingement.  Plan for physical therapy and meloxicam.  Consider an injection but decided against it today. Recheck in about a month.  If not better consider injection.  He will be scheduling at the same day his wife will be here as well.  Visit conducted using a Khmer interpreter   PDMP not reviewed this encounter. Orders Placed This Encounter  Procedures   [place] LIMITED JOINT SPACE STRUCTURES UP LEFT(NO LINKED CHARGES)    Order Specific Question:   Reason for Exam (SYMPTOM  OR DIAGNOSIS REQUIRED)    Answer:    left shoulder pain    Order Specific Question:   Preferred imaging location?    Answer:   [facility] referral to Physical Therapy    Referral Priority:   Routine    Referral Type:   Physical Medicine    Referral Reason:   Specialty Services Required    Requested Specialty:   Physical Therapy    Number of Visits Requested:   1   Meds ordered this encounter  Medications   meloxicam (MOBIC) 15 MG tablet    Sig: One tab PO qAM with breakfast for 2 weeks, then daily prn pain.    Dispense:  30 tablet    Refill:  3     Discussed warning signs or symptoms. Please see discharge instructions. Patient expresses understanding.   The above documentation has been reviewed and is accurate and complete Clementeen Graham, M.D.

## 2022-11-12 NOTE — Patient Instructions (Addendum)
Thank you for coming in today.   Schedule return visit on 7/22 with his wife Voeun.  Plan for Physical therapy.   If not better we can do an injection.   Ok to try meloxicam for pain.

## 2022-11-13 ENCOUNTER — Encounter: Payer: Self-pay | Admitting: Internal Medicine

## 2022-11-13 DIAGNOSIS — R109 Unspecified abdominal pain: Secondary | ICD-10-CM | POA: Insufficient documentation

## 2022-11-13 DIAGNOSIS — M25512 Pain in left shoulder: Secondary | ICD-10-CM | POA: Insufficient documentation

## 2022-11-13 DIAGNOSIS — F17200 Nicotine dependence, unspecified, uncomplicated: Secondary | ICD-10-CM | POA: Insufficient documentation

## 2022-11-13 NOTE — Assessment & Plan Note (Signed)
Stable overall, cont prilosec 40 mg qd

## 2022-11-13 NOTE — Assessment & Plan Note (Signed)
Mild to mod intermittent, etiology unclear, for referral Sports Medicine

## 2022-11-13 NOTE — Assessment & Plan Note (Signed)
Etiology unclear, can't r/o renal stone vs other, for lab including cbc, ua and CT renal

## 2022-11-13 NOTE — Assessment & Plan Note (Signed)
Pt counsled to quit, pt not ready °

## 2022-12-08 ENCOUNTER — Ambulatory Visit (INDEPENDENT_AMBULATORY_CARE_PROVIDER_SITE_OTHER): Payer: Medicare Other | Admitting: Family Medicine

## 2022-12-08 ENCOUNTER — Other Ambulatory Visit: Payer: Self-pay

## 2022-12-08 ENCOUNTER — Encounter: Payer: Self-pay | Admitting: Family Medicine

## 2022-12-08 VITALS — BP 128/84 | HR 54 | Ht 64.0 in | Wt 151.0 lb

## 2022-12-08 DIAGNOSIS — M25512 Pain in left shoulder: Secondary | ICD-10-CM | POA: Diagnosis not present

## 2022-12-08 DIAGNOSIS — G8929 Other chronic pain: Secondary | ICD-10-CM

## 2022-12-08 NOTE — Patient Instructions (Addendum)
Thank you for coming in today.   Call physical therapy and schedule your appointment.  [phone]  Recheck in 6 weeks.  Call or go to the ER if you develop a large red swollen joint with extreme pain or oozing puss.

## 2022-12-08 NOTE — Progress Notes (Signed)
   I, Brendan Alexander, CMA acting as a scribe for Brendan Graham, MD.  Brendan Alexander is a 72 y.o. male who presents to [facility] at [facility] today for 53-month f/u L shoulder pain. Pt was last seen by Dr. Denyse Alexander on 11/12/22 and was prescribed meloxicam and referred to PT, but never scheduled any visits.   Today, pt reports he did not go to PT. L shoulder is feeling a bit better. He is not feeling the "burning" pain anymore.  Pt locates pain to the posterior aspect of his L shoulder.  PT apparently called his number twice to schedule.  When I checked that is his daughter's number which is the best number to call him.   Pertinent review of systems: No fevers or chills  Relevant historical information: GERD   Exam:  BP 128/84   Pulse (!) 54   Ht 5\' 4"  (1.626 m)   Wt 151 lb (68.5 kg)   SpO2 99%   BMI 25.92 kg/m  General: Well Developed, well nourished, and in no acute distress.   MSK: Left shoulder normal-appearing Nontender. Normal motion.  Pain with abduction. Intact strength.    Lab and Radiology Results  Procedure: Real-time Ultrasound Guided Injection of left shoulder subacromial bursa Device: Philips Affiniti 50G/GE Logiq Images permanently stored and available for review in PACS Verbal informed consent obtained.  Discussed risks and benefits of procedure. Warned about infection, bleeding, hyperglycemia damage to structures among others. Patient expresses understanding and agreement Time-out conducted.   Noted no overlying erythema, induration, or other signs of local infection.   Skin prepped in a sterile fashion.   Local anesthesia: Topical Ethyl chloride.   With sterile technique and under real time ultrasound guidance: 40 mg of Kenalog and 2 mL Marcaine injected into subacromial bursa. Fluid seen entering the bursa.   Completed without difficulty   Pain immediately resolved suggesting accurate placement of the medication.   Advised to call if  fevers/chills, erythema, induration, drainage, or persistent bleeding.   Images permanently stored and available for review in the ultrasound unit.  Impression: Technically successful ultrasound guided injection.       Assessment and Plan: 71 y.o. male with left shoulder pain due to subacromial bursitis.  Plan for steroid injection today.  Provided Brendan Alexander with the phone number to physical therapy to call directly with the interpreter today.  Recheck in 6 weeks.   PDMP not reviewed this encounter. Orders Placed This Encounter  Procedures   [place] LIMITED JOINT SPACE STRUCTURES UP LEFT(NO LINKED CHARGES)    Order Specific Question:   Reason for Exam (SYMPTOM  OR DIAGNOSIS REQUIRED)    Answer:   left shoulder pain    Order Specific Question:   Preferred imaging location?    Answer:   [facility]   No orders of the defined types were placed in this encounter.    Discussed warning signs or symptoms. Please see discharge instructions. Patient expresses understanding.   The above documentation has been reviewed and is accurate and complete Brendan Alexander, M.D.

## 2023-03-04 ENCOUNTER — Ambulatory Visit: Payer: Medicare Other | Admitting: Gastroenterology

## 2023-03-04 NOTE — Progress Notes (Deleted)
[facility] progress note:  History: Brendan Alexander 03/04/2023  Referring provider: Corwin Levins, MD  Reason for consult/chief complaint: No chief complaint on file.   Subjective  HPI: This patient was seen by me for his first screening colonoscopy with me January 2021.  Complete exam to terminal ileum, good preparation, diminutive right colon tubular adenoma and diminutive right colon SSP removed.  Given those findings, age and current guidelines, no surveillance colonoscopy recommended.  I saw him again in January 2022 with his son in attendance for evaluation of chronic constipation.  Details outlined in that note, but essentially the patient had reported feeling much better for a couple of weeks after his colonoscopy, only to have chronic constipation return.  MiraLAX recommended. ____________   ***   ROS:  Review of Systems   Past Medical History: Past Medical History:  Diagnosis Date   Constipation    GERD (gastroesophageal reflux disease)      Past Surgical History: Past Surgical History:  Procedure Laterality Date   HAND SURGERY       Family History: Family History  Problem Relation Age of Onset   Colon cancer Neg Hx    Colon polyps Neg Hx    Esophageal cancer Neg Hx    Rectal cancer Neg Hx    Stomach cancer Neg Hx     Social History: Social History   Socioeconomic History   Marital status: Married    Spouse name: Not on file   Number of children: 4   Years of education: 10   Highest education level: Not on file  Occupational History   Not on file  Tobacco Use   Smoking status: Former    Current packs/day: 0.50    Average packs/day: 0.5 packs/day for 45.0 years (22.5 ttl pk-yrs)    Types: Cigarettes   Smokeless tobacco: Never  Vaping Use   Vaping status: Never Used  Substance and Sexual Activity   Alcohol use: Yes    Alcohol/week: 0.0 standard drinks of alcohol    Comment: socially   Drug use: No   Sexual  activity: Not on file  Other Topics Concern   Not on file  Social History Narrative   Fun: Read, garden   Denies religious beliefs effecting health care.    Social Determinants of Health   Financial Resource Strain: Low Risk  (01/07/2022)   Overall Financial Resource Strain (CARDIA)    Difficulty of Paying Living Expenses: Not hard at all  Food Insecurity: No Food Insecurity (01/07/2022)   Hunger Vital Sign    Worried About Running Out of Food in the Last Year: Never true    Ran Out of Food in the Last Year: Never true  Transportation Needs: No Transportation Needs (01/07/2022)   PRAPARE - Administrator, Civil Service (Medical): No    Lack of Transportation (Non-Medical): No  Physical Activity: Inactive (01/07/2022)   Exercise Vital Sign    Days of Exercise per Week: 0 days    Minutes of Exercise per Session: 0 min  Stress: No Stress Concern Present (01/07/2022)   [name] - Occupational Stress Questionnaire    Feeling of Stress : Not at all  Social Connections: Moderately Integrated (12/04/2020)   Social Connection and Isolation Panel [NHANES]    Frequency of Communication with Friends and Family: Twice a week    Frequency of Social Gatherings with Friends and Family: Three times a week    Attends Religious  Services: 1 to 4 times per year    Active Member of Clubs or Organizations: No    Attends Banker Meetings: Never    Marital Status: Married    Allergies: No Known Allergies  Outpatient Meds: Current Outpatient Medications  Medication Sig Dispense Refill   meloxicam (MOBIC) 15 MG tablet One tab PO qAM with breakfast for 2 weeks, then daily prn pain. 30 tablet 3   omeprazole (PRILOSEC) 40 MG capsule Take 1 capsule (40 mg total) by mouth daily. 90 capsule 3   polyethylene glycol powder (GLYCOLAX/MIRALAX) 17 GM/SCOOP powder Take 17 g by mouth 2 (two) times daily as needed. 3350 g 1   No current facility-administered  medications for this visit.      ___________________________________________________________________ Objective   Exam:  There were no vitals taken for this visit. Wt Readings from Last 3 Encounters:  12/08/22 151 lb (68.5 kg)  11/12/22 148 lb (67.1 kg)  11/10/22 146 lb (66.2 kg)    General: ***  Eyes: sclera anicteric, no redness ENT: oral mucosa moist without lesions, no cervical or supraclavicular lymphadenopathy CV: ***, no JVD, no peripheral edema Resp: clear to auscultation bilaterally, normal RR and effort noted GI: soft, *** tenderness, with active bowel sounds. No guarding or palpable organomegaly noted. Skin; warm and dry, no rash or jaundice noted Neuro: awake, alert and oriented x 3. Normal gross motor function and fluent speech  Labs:  ***  Radiologic Studies:  ***  Assessment: No diagnosis found.  ***  Plan:  ***  Thank you for the courtesy of this consult.  Please call me with any questions or concerns.  Charlie Pitter III  CC: Referring provider noted above

## 2023-04-27 ENCOUNTER — Encounter: Payer: Self-pay | Admitting: Internal Medicine

## 2023-04-27 ENCOUNTER — Other Ambulatory Visit: Payer: Self-pay

## 2023-04-27 DIAGNOSIS — K219 Gastro-esophageal reflux disease without esophagitis: Secondary | ICD-10-CM

## 2023-04-27 MED ORDER — OMEPRAZOLE 40 MG PO CPDR
40.0000 mg | DELAYED_RELEASE_CAPSULE | Freq: Every day | ORAL | 3 refills | Status: DC
Start: 2023-04-27 — End: 2024-03-10

## 2023-05-02 ENCOUNTER — Other Ambulatory Visit: Payer: Self-pay | Admitting: Internal Medicine

## 2023-05-02 DIAGNOSIS — K219 Gastro-esophageal reflux disease without esophagitis: Secondary | ICD-10-CM

## 2023-09-17 ENCOUNTER — Encounter: Payer: Self-pay | Admitting: Internal Medicine

## 2023-09-17 ENCOUNTER — Ambulatory Visit

## 2023-09-17 ENCOUNTER — Ambulatory Visit (INDEPENDENT_AMBULATORY_CARE_PROVIDER_SITE_OTHER): Admitting: Internal Medicine

## 2023-09-17 VITALS — BP 110/68 | HR 60 | Temp 98.0°F | Ht 64.0 in | Wt 148.0 lb

## 2023-09-17 DIAGNOSIS — R051 Acute cough: Secondary | ICD-10-CM | POA: Diagnosis not present

## 2023-09-17 DIAGNOSIS — R739 Hyperglycemia, unspecified: Secondary | ICD-10-CM | POA: Diagnosis not present

## 2023-09-17 DIAGNOSIS — E538 Deficiency of other specified B group vitamins: Secondary | ICD-10-CM | POA: Diagnosis not present

## 2023-09-17 DIAGNOSIS — Z125 Encounter for screening for malignant neoplasm of prostate: Secondary | ICD-10-CM | POA: Diagnosis not present

## 2023-09-17 DIAGNOSIS — R059 Cough, unspecified: Secondary | ICD-10-CM | POA: Diagnosis not present

## 2023-09-17 DIAGNOSIS — E559 Vitamin D deficiency, unspecified: Secondary | ICD-10-CM | POA: Diagnosis not present

## 2023-09-17 DIAGNOSIS — E7800 Pure hypercholesterolemia, unspecified: Secondary | ICD-10-CM

## 2023-09-17 DIAGNOSIS — K219 Gastro-esophageal reflux disease without esophagitis: Secondary | ICD-10-CM

## 2023-09-17 DIAGNOSIS — J069 Acute upper respiratory infection, unspecified: Secondary | ICD-10-CM | POA: Diagnosis not present

## 2023-09-17 DIAGNOSIS — R7989 Other specified abnormal findings of blood chemistry: Secondary | ICD-10-CM

## 2023-09-17 DIAGNOSIS — Z0001 Encounter for general adult medical examination with abnormal findings: Secondary | ICD-10-CM

## 2023-09-17 LAB — HEPATIC FUNCTION PANEL
ALT: 114 U/L — ABNORMAL HIGH (ref 0–53)
AST: 114 U/L — ABNORMAL HIGH (ref 0–37)
Albumin: 3.9 g/dL (ref 3.5–5.2)
Alkaline Phosphatase: 58 U/L (ref 39–117)
Bilirubin, Direct: 0.3 mg/dL (ref 0.0–0.3)
Total Bilirubin: 0.9 mg/dL (ref 0.2–1.2)
Total Protein: 7.6 g/dL (ref 6.0–8.3)

## 2023-09-17 LAB — VITAMIN B12: Vitamin B-12: 662 pg/mL (ref 211–911)

## 2023-09-17 LAB — LIPID PANEL
Cholesterol: 168 mg/dL (ref 0–200)
HDL: 40.6 mg/dL (ref >39.00)
LDL Cholesterol: 106 mg/dL — ABNORMAL HIGH (ref 0–99)
NonHDL: 127.64
Total CHOL/HDL Ratio: 4
Triglycerides: 109 mg/dL (ref 0.0–149.0)
VLDL: 21.8 mg/dL (ref 0.0–40.0)

## 2023-09-17 LAB — BASIC METABOLIC PANEL WITH GFR
BUN: 15 mg/dL (ref 6–23)
CO2: 28 meq/L (ref 19–32)
Calcium: 9.1 mg/dL (ref 8.4–10.5)
Chloride: 104 meq/L (ref 96–112)
Creatinine, Ser: 0.91 mg/dL (ref 0.40–1.50)
GFR: 83.28 mL/min (ref >60.00)
Glucose, Bld: 100 mg/dL — ABNORMAL HIGH (ref 70–99)
Potassium: 3.6 meq/L (ref 3.5–5.1)
Sodium: 138 meq/L (ref 135–145)

## 2023-09-17 LAB — CBC WITH DIFFERENTIAL/PLATELET
Basophils Absolute: 0.1 10*3/uL (ref 0.0–0.1)
Basophils Relative: 0.9 % (ref 0.0–3.0)
Eosinophils Absolute: 0.3 10*3/uL (ref 0.0–0.7)
Eosinophils Relative: 4.8 % (ref 0.0–5.0)
HCT: 45.4 % (ref 39.0–52.0)
Hemoglobin: 14.8 g/dL (ref 13.0–17.0)
Lymphocytes Relative: 29.6 % (ref 12.0–46.0)
Lymphs Abs: 1.7 10*3/uL (ref 0.7–4.0)
MCHC: 32.5 g/dL (ref 30.0–36.0)
MCV: 89.2 fl (ref 78.0–100.0)
Monocytes Absolute: 0.5 10*3/uL (ref 0.1–1.0)
Monocytes Relative: 8.5 % (ref 3.0–12.0)
Neutro Abs: 3.3 10*3/uL (ref 1.4–7.7)
Neutrophils Relative %: 56.2 % (ref 43.0–77.0)
Platelets: 172 10*3/uL (ref 150.0–400.0)
RBC: 5.09 Mil/uL (ref 4.22–5.81)
RDW: 14.1 % (ref 11.5–15.5)
WBC: 5.9 10*3/uL (ref 4.0–10.5)

## 2023-09-17 LAB — VITAMIN D 25 HYDROXY (VIT D DEFICIENCY, FRACTURES): VITD: 17.67 ng/mL — ABNORMAL LOW (ref 30.00–100.00)

## 2023-09-17 LAB — TSH: TSH: 1.94 u[IU]/mL (ref 0.35–5.50)

## 2023-09-17 LAB — PSA: PSA: 0.85 ng/mL (ref 0.10–4.00)

## 2023-09-17 MED ORDER — HYDROCODONE BIT-HOMATROP MBR 5-1.5 MG/5ML PO SOLN: 5.0000 mL | Freq: Four times a day (QID) | ORAL | 0 refills | Status: AC | PRN

## 2023-09-17 MED ORDER — AMOXICILLIN-POT CLAVULANATE 875-125 MG PO TABS: 1.0000 | ORAL_TABLET | Freq: Two times a day (BID) | ORAL | 0 refills | Status: DC

## 2023-09-17 NOTE — Progress Notes (Signed)
 Patient ID: Brendan Alexander, male   DOB: 1950-05-30, 73 y.o.   MRN: [id number]         Chief Complaint:: wellness exam and Otalgia (And sinus pressure , has been going on for a couple of months , gets worse when he is doing something on both ears and says he is not sleeping well and chest muscle sore )  ,        HPI:  Brendan Alexander is a 73 y.o. male here for wellness exam with interpretor; declines immunizations, o/w up to date                        Also 3 days onset bilateral ear pain with feverish, chills and worsening cough with left chest pain soreness to touch, o/w non pleuritic, non postional, non exertional.  Pt denies other chest pain, increased sob or doe, wheezing, orthopnea, PND, increased LE swelling, palpitations, dizziness or syncope.   Pt denies polydipsia, polyuria, or new focal neuro s/s.    Pt denies fever, wt loss, night sweats, loss of appetite, or other constitutional symptoms  Denies worsening reflux, abd pain, dysphagia, n/v, bowel change or blood.  Never saw GI last yr after elevated LFTs with labs   Wt Readings from Last 3 Encounters:  09/17/23 148 lb (67.1 kg)  12/08/22 151 lb (68.5 kg)  11/12/22 148 lb (67.1 kg)   BP Readings from Last 3 Encounters:  09/17/23 110/68  12/08/22 128/84  11/12/22 128/84   Immunization History  Administered Date(s) Administered   PNEUMOCOCCAL CONJUGATE-20 11/10/2022   Health Maintenance Due  Topic Date Due   Hepatitis C Screening  Never done   DTaP/Tdap/Td (1 - Tdap) Never done   Lung Cancer Screening  Never done   Zoster Vaccines- Shingrix (1 of 2) Never done   Medicare Annual Wellness (AWV)  01/08/2023   COVID-19 Vaccine (1 - 2024-25 season) Never done      Past Medical History:  Diagnosis Date   Constipation    GERD (gastroesophageal reflux disease)    Past Surgical History:  Procedure Laterality Date   HAND SURGERY      reports that he has quit smoking. His smoking use included cigarettes. He has a 22.5 pack-year  smoking history. He has never used smokeless tobacco. He reports current alcohol use. He reports that he does not use drugs. family history is not on file. No Known Allergies Current Outpatient Medications on File Prior to Visit  Medication Sig Dispense Refill   meloxicam  (MOBIC ) 15 MG tablet One tab PO qAM with breakfast for 2 weeks, then daily prn pain. 30 tablet 3   omeprazole  (PRILOSEC) 40 MG capsule Take 1 capsule (40 mg total) by mouth daily. 90 capsule 3   polyethylene glycol powder (GLYCOLAX /MIRALAX ) 17 GM/SCOOP powder Take 17 g by mouth 2 (two) times daily as needed. 3350 g 1   No current facility-administered medications on file prior to visit.        ROS:  All others reviewed and negative.  Objective        PE:  BP 110/68 (BP Location: Left Arm, Patient Position: Sitting, Cuff Size: Normal)   Pulse 60   Temp 98 F (36.7 C) (Oral)   Ht 5\' 4"  (1.626 m)   Wt 148 lb (67.1 kg)   SpO2 99%   BMI 25.40 kg/m                 Constitutional: Pt appears  mild ill               HENT: Head: NCAT.                Right Ear: External ear normal.                 Left Ear: External ear normal. Bilat tm's with severe erythema.  Max sinus areas mild tender.  Pharynx with mild erythema, no exudate                 Eyes: . Pupils are equal, round, and reactive to light. Conjunctivae and EOM are normal               Nose: without d/c or deformity               Neck: Neck supple. Gross normal ROM               Cardiovascular: Normal rate and regular rhythm.                 Pulmonary/Chest: Effort normal and breath sounds without rales or wheezing.                Abd:  Soft, NT, ND, + BS, no organomegaly               Neurological: Pt is alert. At baseline orientation, motor grossly intact               Skin: Skin is warm. No rashes, no other new lesions, LE edema - none               Psychiatric: Pt behavior is normal without agitation   Micro: none  Cardiac tracings I have personally  interpreted today:  none  Pertinent Radiological findings (summarize): none   Lab Results  Component Value Date   WBC 5.2 11/10/2022   HGB 14.9 11/10/2022   HCT 46.6 11/10/2022   PLT 173.0 11/10/2022   GLUCOSE 86 11/10/2022   CHOL 172 11/10/2022   TRIG 81.0 11/10/2022   HDL 53.70 11/10/2022   LDLCALC 102 (H) 11/10/2022   ALT 142 (H) 11/10/2022   AST 101 (H) 11/10/2022   NA 141 11/10/2022   K 4.9 11/10/2022   CL 105 11/10/2022   CREATININE 1.11 11/10/2022   BUN 18 11/10/2022   CO2 29 11/10/2022   TSH 1.65 11/10/2022   PSA 1.18 11/10/2022   HGBA1C 5.5 11/10/2022   Assessment/Plan:  [name] is a 73 y.o. Asian [4] male with  has a past medical history of Constipation and GERD (gastroesophageal reflux disease).  Acute cough Mild to mod, exam c/w bilateral otitis / uri, for antibx course augmentin  course, cough med  prn,,  to f/u any worsening symptoms or concerns, also cough given chest pain though suspect more msk  Elevated liver function tests Mild to mod, did not see GI last yr but willing now, for referral GI and repeat LFTs,  to f/u any worsening symptoms or concerns  Gastroesophageal reflux disease Stable overall, cont PPI omeprazole  40 qd  Encounter for well adult exam with abnormal findings Age and sex appropriate education and counseling updated with regular exercise and diet Referrals for preventative services - none needed Immunizations addressed - declines all Smoking counseling  - none needed Evidence for depression or other mood disorder - none significant Most recent labs reviewed. I have personally reviewed and have noted: 1) the patient's medical and social history 2) The  patient's current medications and supplements 3) The patient's height, weight, and BMI have been recorded in the chart   Vitamin D  deficiency Last vitamin D  Lab Results  Component Value Date   VD25OH 29.05 (L) 11/10/2022   Low, to start oral replacement  Followup: No  follow-ups on file.  Rosalia Colonel, MD 09/17/2023 7:29 PM [facility]

## 2023-09-17 NOTE — Patient Instructions (Addendum)
 Please take all new medication as prescribed - the antibiotic, and cough medicine  Please continue all other medications as before, and refills have been done if requested.  Please have the pharmacy call with any other refills you may need.  Please continue your efforts at being more active, low cholesterol diet, and weight control.  You are otherwise up to date with prevention measures today.  Please keep your appointments with your specialists as you may have planned  You will be contacted regarding the referral for: Gastroenterology due to increased liver test results for unknown reason  Please go to the XRAY Department in the first floor for the x-ray testing  Please go to the LAB at the blood drawing area for the tests to be done  You will be contacted by phone if any changes need to be made immediately.  Otherwise, you will receive a letter about your results with an explanation, but please check with MyChart first.  Please make an Appointment to return in 6 months, or sooner if needed

## 2023-09-17 NOTE — Assessment & Plan Note (Signed)
 Last vitamin D  Lab Results  Component Value Date   VD25OH 29.05 (L) 11/10/2022   Low, to start oral replacement

## 2023-09-17 NOTE — Assessment & Plan Note (Signed)
Age and sex appropriate education and counseling updated with regular exercise and diet Referrals for preventative services - none needed Immunizations addressed - declines all Smoking counseling  - none needed Evidence for depression or other mood disorder - none significant Most recent labs reviewed. I have personally reviewed and have noted: 1) the patient's medical and social history 2) The patient's current medications and supplements 3) The patient's height, weight, and BMI have been recorded in the chart

## 2023-09-17 NOTE — Assessment & Plan Note (Signed)
 Mild to mod, did not see GI last yr but willing now, for referral GI and repeat LFTs,  to f/u any worsening symptoms or concerns

## 2023-09-17 NOTE — Assessment & Plan Note (Signed)
 Stable overall, cont PPI omeprazole  40 qd

## 2023-09-17 NOTE — Assessment & Plan Note (Addendum)
 Mild to mod, exam c/w bilateral otitis / uri, for antibx course augmentin  course, cough med  prn,,  to f/u any worsening symptoms or concerns, also cough given chest pain though suspect more msk

## 2023-09-18 ENCOUNTER — Encounter: Payer: Self-pay | Admitting: Internal Medicine

## 2023-09-18 LAB — URINALYSIS, ROUTINE W REFLEX MICROSCOPIC
Bilirubin Urine: NEGATIVE
Hgb urine dipstick: NEGATIVE
Ketones, ur: NEGATIVE
Leukocytes,Ua: NEGATIVE
Nitrite: NEGATIVE
RBC / HPF: NONE SEEN (ref 0–?)
Specific Gravity, Urine: 1.01 (ref 1.000–1.030)
Total Protein, Urine: NEGATIVE
Urine Glucose: NEGATIVE
Urobilinogen, UA: >=8 — AB (ref 0.0–1.0)
WBC, UA: NONE SEEN (ref 0–?)
pH: 7 (ref 5.0–8.0)

## 2023-09-18 LAB — HEMOGLOBIN A1C: Hgb A1c MFr Bld: 5.5 % (ref 4.6–6.5)

## 2023-09-21 ENCOUNTER — Encounter: Payer: Self-pay | Admitting: Internal Medicine

## 2023-09-24 DIAGNOSIS — H524 Presbyopia: Secondary | ICD-10-CM | POA: Diagnosis not present

## 2023-09-24 DIAGNOSIS — H5203 Hypermetropia, bilateral: Secondary | ICD-10-CM | POA: Diagnosis not present

## 2023-09-24 DIAGNOSIS — H2513 Age-related nuclear cataract, bilateral: Secondary | ICD-10-CM | POA: Diagnosis not present

## 2023-10-15 ENCOUNTER — Encounter: Payer: Self-pay | Admitting: Internal Medicine

## 2024-03-10 ENCOUNTER — Ambulatory Visit (INDEPENDENT_AMBULATORY_CARE_PROVIDER_SITE_OTHER)

## 2024-03-10 ENCOUNTER — Telehealth: Payer: Self-pay

## 2024-03-10 VITALS — BP 110/70 | HR 73 | Ht 63.0 in | Wt 147.8 lb

## 2024-03-10 DIAGNOSIS — Z0000 Encounter for general adult medical examination without abnormal findings: Secondary | ICD-10-CM

## 2024-03-10 DIAGNOSIS — K219 Gastro-esophageal reflux disease without esophagitis: Secondary | ICD-10-CM

## 2024-03-10 MED ORDER — MELOXICAM 15 MG PO TABS
ORAL_TABLET | ORAL | 3 refills | Status: AC
Start: 2024-03-10 — End: ?

## 2024-03-10 MED ORDER — OMEPRAZOLE 40 MG PO CPDR: 40.0000 mg | DELAYED_RELEASE_CAPSULE | Freq: Every day | ORAL | 0 refills | Status: DC

## 2024-03-10 NOTE — Telephone Encounter (Signed)
 Ok refill is done to bridge to next appt

## 2024-03-10 NOTE — Patient Instructions (Signed)
 Brendan Alexander,  Thank you for taking the time for your Medicare Wellness Visit. I appreciate your continued commitment to your health goals. Please review the care plan we discussed, and feel free to reach out if I can assist you further.  Medicare recommends these wellness visits once per year to help you and your care team stay ahead of potential health issues. These visits are designed to focus on prevention, allowing your provider to concentrate on managing your acute and chronic conditions during your regular appointments.  Please note that Annual Wellness Visits do not include a physical exam. Some assessments may be limited, especially if the visit was conducted virtually. If needed, we may recommend a separate in-person follow-up with your provider.  Ongoing Care Seeing your primary care provider every 3 to 6 months helps us  monitor your health and provide consistent, personalized care. Next office visit on 03/28/2024.  I have placed a request for your refills today.    Referrals If a referral was made during today's visit and you haven't received any updates within two weeks, please contact the referred provider directly to check on the status.  Recommended Screenings:  Health Maintenance  Topic Date Due   Hepatitis C Screening  Never done   DTaP/Tdap/Td vaccine (1 - Tdap) Never done   Screening for Lung Cancer  Never done   Zoster (Shingles) Vaccine (1 of 2) Never done   Flu Shot  Never done   COVID-19 Vaccine (1 - 2025-26 season) Never done   Medicare Annual Wellness Visit  03/10/2025   Pneumococcal Vaccine for age over 69  Completed   Meningitis B Vaccine  Aged Out   Colon Cancer Screening  Discontinued       03/10/2024    2:41 PM  Advanced Directives  Does Patient Have a Medical Advance Directive? Yes  Type of Estate agent of [place];Living will  Does patient want to make changes to medical advance directive? No - Patient declined  Copy of  Healthcare Power of Attorney in Chart? Yes - validated most recent copy scanned in chart (See row information)   Advance Care Planning is important because it: Ensures you receive medical care that aligns with your values, goals, and preferences. Provides guidance to your family and loved ones, reducing the emotional burden of decision-making during critical moments.  Vision: Annual vision screenings are recommended for early detection of glaucoma, cataracts, and diabetic retinopathy. These exams can also reveal signs of chronic conditions such as diabetes and high blood pressure.  Dental: Annual dental screenings help detect early signs of oral cancer, gum disease, and other conditions linked to overall health, including heart disease and diabetes.  Please see the attached documents for additional preventive care recommendations.

## 2024-03-10 NOTE — Telephone Encounter (Signed)
 Patient is requesting a refill today on Omeprazole  40mg  daily and on Meloxicam  15 mg.  He is scheduled for a follow up visit on 03/28/24.

## 2024-03-10 NOTE — Progress Notes (Signed)
 Subjective:   Brendan Alexander is a 73 y.o. who presents for a Medicare Wellness preventive visit.  As a reminder, Annual Wellness Visits don't include a physical exam, and some assessments may be limited, especially if this visit is performed virtually. We may recommend an in-person follow-up visit with your provider if needed.  Visit Complete: In person  Persons Participating in Visit: Interpreter and patient was present during visit.  AWV Questionnaire: No: Patient Medicare AWV questionnaire was not completed prior to this visit.  Cardiac Risk Factors include: advanced age (>44men, >25 women);male gender     Objective:    Today's Vitals   03/10/24 1429 03/10/24 1432  BP:  110/70  Pulse:  73  SpO2:  98%  Weight:  147 lb 12.8 oz (67 kg)  Height:  5' 3 (1.6 m)  PainSc: 8     Body mass index is 26.18 kg/m.     03/10/2024    2:41 PM 01/07/2022    1:48 PM  Advanced Directives  Does Patient Have a Medical Advance Directive? Yes Yes  Type of Estate agent of [place];Living will Healthcare Power of [place];Living will  Does patient want to make changes to medical advance directive? No - Patient declined   Copy of Healthcare Power of Attorney in Chart? Yes - validated most recent copy scanned in chart (See row information) Yes - validated most recent copy scanned in chart (See row information)    Current Medications (verified) Outpatient Encounter Medications as of 03/10/2024  Medication Sig   meloxicam  (MOBIC ) 15 MG tablet One tab PO qAM with breakfast for 2 weeks, then daily prn pain.   omeprazole  (PRILOSEC) 40 MG capsule Take 1 capsule (40 mg total) by mouth daily.   amoxicillin -clavulanate (AUGMENTIN ) 875-125 MG tablet Take 1 tablet by mouth 2 (two) times daily. (Patient not taking: Reported on 03/10/2024)   polyethylene glycol powder (GLYCOLAX /MIRALAX ) 17 GM/SCOOP powder Take 17 g by mouth 2 (two) times daily as needed. (Patient not taking: Reported  on 03/10/2024)   No facility-administered encounter medications on file as of 03/10/2024.    Allergies (verified) Patient has no known allergies.   History: Past Medical History:  Diagnosis Date   Constipation    GERD (gastroesophageal reflux disease)    Past Surgical History:  Procedure Laterality Date   HAND SURGERY     Family History  Problem Relation Age of Onset   Colon cancer Neg Hx    Colon polyps Neg Hx    Esophageal cancer Neg Hx    Rectal cancer Neg Hx    Stomach cancer Neg Hx    Social History   Socioeconomic History   Marital status: Married    Spouse name: Not on file   Number of children: 4   Years of education: 10   Highest education level: Not on file  Occupational History   Occupation: RETIRED  Tobacco Use   Smoking status: Former    Current packs/day: 0.50    Average packs/day: 0.5 packs/day for 45.0 years (22.5 ttl pk-yrs)    Types: Cigarettes   Smokeless tobacco: Never  Vaping Use   Vaping status: Never Used  Substance and Sexual Activity   Alcohol use: Yes    Alcohol/week: 0.0 standard drinks of alcohol    Comment: socially   Drug use: No   Sexual activity: Not on file  Other Topics Concern   Not on file  Social History Narrative   Fun: Read, garden   Denies  religious beliefs effecting health care.       Lives with wife and son/2025   Social Drivers of Health   Financial Resource Strain: Low Risk  (03/10/2024)   Overall Financial Resource Strain (CARDIA)    Difficulty of Paying Living Expenses: Not hard at all  Food Insecurity: No Food Insecurity (03/10/2024)   Hunger Vital Sign    Worried About Running Out of Food in the Last Year: Never true    Ran Out of Food in the Last Year: Never true  Transportation Needs: No Transportation Needs (03/10/2024)   PRAPARE - Administrator, Civil Service (Medical): No    Lack of Transportation (Non-Medical): No  Physical Activity: Inactive (03/10/2024)   Exercise Vital Sign     Days of Exercise per Week: 0 days    Minutes of Exercise per Session: 0 min  Stress: No Stress Concern Present (03/10/2024)   [name] - Occupational Stress Questionnaire    Feeling of Stress: Not at all  Social Connections: Moderately Integrated (03/10/2024)   Social Connection and Isolation Panel    Frequency of Communication with Friends and Family: Never    Frequency of Social Gatherings with Friends and Family: Once a week    Attends Religious Services: More than 4 times per year    Active Member of [employer] or Organizations: Yes    Attends Engineer, structural: More than 4 times per year    Marital Status: Married    Tobacco Counseling Counseling given: Not Answered    Clinical Intake:  Pre-visit preparation completed: Yes  Pain : 0-10 Pain Score: 8  Pain Type: Chronic pain Pain Location:  (stomach pain) Pain Descriptors / Indicators: Aching, Discomfort Pain Onset: 1 to 4 weeks ago Pain Frequency: Constant Pain Relieving Factors: heart burn medication Effect of Pain on Daily Activities: Pain has worsened in the last 4 weeks  Pain Relieving Factors: heart burn medication  BMI - recorded: 26.18 Nutritional Status: BMI 25 -29 Overweight Nutritional Risks: None Diabetes: No  Lab Results  Component Value Date   HGBA1C 5.5 09/17/2023   HGBA1C 5.5 11/10/2022     How often do you need to have someone help you when you read instructions, pamphlets, or other written materials from your doctor or pharmacy?: 5 - Always (son helps with reading)  Interpreter Needed?: Yes Interpreter Agency: [place] Interpreter Name: [facility]  Information entered by :: Trinna Broach, RMA   Activities of Daily Living     03/10/2024    2:37 PM  In your present state of health, do you have any difficulty performing the following activities:  Hearing? 0  Vision? 0  Difficulty concentrating or making decisions? 0  Walking or climbing stairs? 0   Dressing or bathing? 0  Doing errands, shopping? 0  Preparing Food and eating ? N  Using the Toilet? N  In the past six months, have you accidently leaked urine? N  Do you have problems with loss of bowel control? N  Managing your Medications? N  Managing your Finances? N  Housekeeping or managing your Housekeeping? N    Patient Care Team: Norleen Lynwood ORN, MD as PCP - General (Internal Medicine)  I have updated your Care Teams any recent Medical Services you may have received from other providers in the past year.     Assessment:   This is a routine wellness examination for Brendan Alexander.  Hearing/Vision screen Hearing Screening - Comments:: Denies hearing difficulties  Vision Screening - Comments:: Wears eyeglasses/Per pt-up to date/does not know eye dr. name   Goals Addressed   None    Depression Screen     03/10/2024    2:45 PM 09/17/2023    3:04 PM 11/10/2022   11:00 AM 11/10/2022   10:59 AM 01/07/2022    1:50 PM 12/04/2020    1:54 PM  PHQ 2/9 Scores  PHQ - 2 Score 0 0 0 0 0 0  PHQ- 9 Score 0  2       Fall Risk     03/10/2024    2:43 PM 09/17/2023    3:09 PM 11/10/2022   10:59 AM 01/07/2022    1:50 PM 12/04/2020    1:57 PM  Fall Risk   Falls in the past year? 0 0 0 0 0  Number falls in past yr: 0 0 0 0 0  Injury with Fall? 0 0 0 0 0  Risk for fall due to :  No Fall Risks No Fall Risks Medication side effect Impaired vision  Follow up Falls evaluation completed;Falls prevention discussed Falls evaluation completed Falls evaluation completed Falls evaluation completed;Education provided;Falls prevention discussed  Falls prevention discussed      Data saved with a previous flowsheet row definition    MEDICARE RISK AT HOME:  Medicare Risk at Home Any stairs in or around the home?: Yes (9 steps) If so, are there any without handrails?: No Home free of loose throw rugs in walkways, pet beds, electrical cords, etc?: Yes Adequate lighting in your home to reduce risk of  falls?: Yes Life alert?: No Use of a cane, walker or w/c?: No Grab bars in the bathroom?: Yes Shower chair or bench in shower?: Yes Elevated toilet seat or a handicapped toilet?: Yes  TIMED UP AND GO:  Was the test performed?  Yes  Length of time to ambulate 10 feet: 15 sec Gait steady and fast without use of assistive device  Cognitive Function: Unable: Due to language barrier, hearing or vision limitations or other language        12/04/2020    2:04 PM  6CIT Screen  What Year? 0 points  What month? 0 points  What time? 0 points  Count back from 20 0 points  Months in reverse 4 points  Repeat phrase 10 points  Total Score 14 points    Immunizations Immunization History  Administered Date(s) Administered   PNEUMOCOCCAL CONJUGATE-20 11/10/2022    Screening Tests Health Maintenance  Topic Date Due   Hepatitis C Screening  Never done   DTaP/Tdap/Td (1 - Tdap) Never done   Lung Cancer Screening  Never done   Zoster Vaccines- Shingrix (1 of 2) Never done   Influenza Vaccine  Never done   COVID-19 Vaccine (1 - 2025-26 season) Never done   Medicare Annual Wellness (AWV)  03/10/2025   Pneumococcal Vaccine: 50+ Years  Completed   Meningococcal B Vaccine  Aged Out   Colonoscopy  Discontinued    Health Maintenance Items Addressed: See Nurse Notes at the end of this note  Additional Screening:  Vision Screening: Recommended annual ophthalmology exams for early detection of glaucoma and other disorders of the eye. Is the patient up to date with their annual eye exam?  Yes  Who is the provider or what is the name of the office in which the patient attends annual eye exams? Patient stated he is up to date but does not know who provider is.  Dental Screening:  Recommended annual dental exams for proper oral hygiene  Community Resource Referral / Chronic Care Management: CRR required this visit?  No   CCM required this visit?  No   Plan:    I have personally  reviewed and noted the following in the patient's chart:   Medical and social history Use of alcohol, tobacco or illicit drugs  Current medications and supplements including opioid prescriptions. Patient is not currently taking opioid prescriptions. Functional ability and status Nutritional status Physical activity Advanced directives List of other physicians Hospitalizations, surgeries, and ER visits in previous 12 months Vitals Screenings to include cognitive, depression, and falls Referrals and appointments  In addition, I have reviewed and discussed with patient certain preventive protocols, quality metrics, and best practice recommendations. A written personalized care plan for preventive services as well as general preventive health recommendations were provided to patient.   Brendan Alexander, CMA   03/10/2024   After Visit Summary: (In Person-Printed) AVS printed and given to the patient  Notes:  Patient is due for a 2nd Shingrix vaccine.  Patient is due for a pneumonia vaccine and would like to discuss it with Dr. Norleen.  Patient is requesting a refill on  omeprazole  (PRILOSEC) 40 MG capsule and meloxicam  (MOBIC ) 15 MG tablet today.  He also has been has worsening stomach issues.  He is scheduled to see Dr. Norleen 1st available.

## 2024-03-28 ENCOUNTER — Ambulatory Visit: Payer: Self-pay | Admitting: Internal Medicine

## 2024-03-28 ENCOUNTER — Encounter: Payer: Self-pay | Admitting: Internal Medicine

## 2024-03-28 ENCOUNTER — Ambulatory Visit (INDEPENDENT_AMBULATORY_CARE_PROVIDER_SITE_OTHER): Admitting: Internal Medicine

## 2024-03-28 VITALS — BP 112/78 | HR 50 | Temp 97.8°F | Ht 63.0 in | Wt 149.0 lb

## 2024-03-28 DIAGNOSIS — K219 Gastro-esophageal reflux disease without esophagitis: Secondary | ICD-10-CM | POA: Diagnosis not present

## 2024-03-28 DIAGNOSIS — R7989 Other specified abnormal findings of blood chemistry: Secondary | ICD-10-CM

## 2024-03-28 DIAGNOSIS — E559 Vitamin D deficiency, unspecified: Secondary | ICD-10-CM | POA: Diagnosis not present

## 2024-03-28 DIAGNOSIS — R109 Unspecified abdominal pain: Secondary | ICD-10-CM

## 2024-03-28 DIAGNOSIS — Z720 Tobacco use: Secondary | ICD-10-CM

## 2024-03-28 LAB — HEPATIC FUNCTION PANEL
ALT: 186 U/L — ABNORMAL HIGH (ref 0–53)
AST: 151 U/L — ABNORMAL HIGH (ref 0–37)
Albumin: 4.1 g/dL (ref 3.5–5.2)
Alkaline Phosphatase: 64 U/L (ref 39–117)
Bilirubin, Direct: 0.2 mg/dL (ref 0.0–0.3)
Total Bilirubin: 0.7 mg/dL (ref 0.2–1.2)
Total Protein: 8.2 g/dL (ref 6.0–8.3)

## 2024-03-28 LAB — URINALYSIS, ROUTINE W REFLEX MICROSCOPIC
Bilirubin Urine: NEGATIVE
Hgb urine dipstick: NEGATIVE
Ketones, ur: NEGATIVE
Leukocytes,Ua: NEGATIVE
Nitrite: NEGATIVE
RBC / HPF: NONE SEEN (ref 0–?)
Specific Gravity, Urine: 1.01 (ref 1.000–1.030)
Total Protein, Urine: NEGATIVE
Urine Glucose: NEGATIVE
Urobilinogen, UA: 0.2 (ref 0.0–1.0)
WBC, UA: NONE SEEN (ref 0–?)
pH: 6 (ref 5.0–8.0)

## 2024-03-28 LAB — CBC WITH DIFFERENTIAL/PLATELET
Basophils Absolute: 0 K/uL (ref 0.0–0.1)
Basophils Relative: 0.9 % (ref 0.0–3.0)
Eosinophils Absolute: 0.2 K/uL (ref 0.0–0.7)
Eosinophils Relative: 5.2 % — ABNORMAL HIGH (ref 0.0–5.0)
HCT: 46.2 % (ref 39.0–52.0)
Hemoglobin: 14.9 g/dL (ref 13.0–17.0)
Lymphocytes Relative: 31.9 % (ref 12.0–46.0)
Lymphs Abs: 1.5 K/uL (ref 0.7–4.0)
MCHC: 32.3 g/dL (ref 30.0–36.0)
MCV: 89.3 fl (ref 78.0–100.0)
Monocytes Absolute: 0.4 K/uL (ref 0.1–1.0)
Monocytes Relative: 8.2 % (ref 3.0–12.0)
Neutro Abs: 2.6 K/uL (ref 1.4–7.7)
Neutrophils Relative %: 53.8 % (ref 43.0–77.0)
Platelets: 159 K/uL (ref 150.0–400.0)
RBC: 5.17 Mil/uL (ref 4.22–5.81)
RDW: 13.7 % (ref 11.5–15.5)
WBC: 4.8 K/uL (ref 4.0–10.5)

## 2024-03-28 LAB — BASIC METABOLIC PANEL WITH GFR
BUN: 19 mg/dL (ref 6–23)
CO2: 28 meq/L (ref 19–32)
Calcium: 9.9 mg/dL (ref 8.4–10.5)
Chloride: 104 meq/L (ref 96–112)
Creatinine, Ser: 1.15 mg/dL (ref 0.40–1.50)
GFR: 62.65 mL/min (ref >60.00)
Glucose, Bld: 77 mg/dL (ref 70–99)
Potassium: 4.2 meq/L (ref 3.5–5.1)
Sodium: 139 meq/L (ref 135–145)

## 2024-03-28 LAB — LIPASE: Lipase: 48 U/L (ref 11.0–59.0)

## 2024-03-28 MED ORDER — PANTOPRAZOLE SODIUM 40 MG PO TBEC: 40.0000 mg | DELAYED_RELEASE_TABLET | Freq: Every day | ORAL | 3 refills | Status: AC

## 2024-03-28 NOTE — Assessment & Plan Note (Signed)
 Last vitamin D  Lab Results  Component Value Date   VD25OH 17.67 (L) 09/17/2023   Low, for oral replacement

## 2024-03-28 NOTE — Patient Instructions (Signed)
 Ok to stop the omeprazole  (prilosec)  Please take all new medication as prescribed - the protonix  40 mg per day  Ok to try the OTC Miralax  1 capful daily as needed for constipation  Please continue all other medications as before, and refills have been done if requested.  Please have the pharmacy call with any other refills you may need.  Please keep your appointments with your specialists as you may have planned  You will be contacted regarding the referral for: Gastroenterology  Please go to the LAB at the blood drawing area for the tests to be done  You will be contacted by phone if any changes need to be made immediately.  Otherwise, you will receive a letter about your results with an explanation, but please check with MyChart first.  Please make an Appointment to return in 6 months, or sooner if needed

## 2024-03-28 NOTE — Assessment & Plan Note (Signed)
 Etiology unclear, declines imaging for now, for GI referral

## 2024-03-28 NOTE — Assessment & Plan Note (Signed)
 Pt requests change PPI - for change to protonix  40 qd

## 2024-03-28 NOTE — Assessment & Plan Note (Signed)
 Etiology unclear, for trial miralax  every day prn

## 2024-03-28 NOTE — Assessment & Plan Note (Signed)
 Pt counsled to quit, pt not ready

## 2024-03-28 NOTE — Progress Notes (Signed)
 Patient ID: Brendan Alexander, male   DOB: 17-Jun-1950, 73 y.o.   MRN: [id number]        Chief Complaint: follow up reflux, constipation, left abd pain, elevated LFTs, low vit d, and smoker       HPI:  Brendan Alexander is a 73 y.o. male here with interpretor, with c/o improved reflux after last visit but no longer seems to be working as well, and increased left sided pain, which might be improved with BM, and has intermittent constipation. Pain can radiate towards the left side and lower back.   Denies urinary symptoms such as dysuria, frequency, urgency, flank pain, hematuria or n/v, fever, chills or wt loss.  S/p colonoscopy 2021, but has not seen GI since last visit.  Still smoking, not ready to quit.        Wt Readings from Last 3 Encounters:  03/28/24 149 lb (67.6 kg)  03/10/24 147 lb 12.8 oz (67 kg)  09/17/23 148 lb (67.1 kg)   BP Readings from Last 3 Encounters:  03/28/24 112/78  03/10/24 110/70  09/17/23 110/68         Past Medical History:  Diagnosis Date   Constipation    GERD (gastroesophageal reflux disease)    Past Surgical History:  Procedure Laterality Date   HAND SURGERY      reports that he has quit smoking. His smoking use included cigarettes. He has a 22.5 pack-year smoking history. He has never used smokeless tobacco. He reports current alcohol use. He reports that he does not use drugs. family history is not on file. No Known Allergies Current Outpatient Medications on File Prior to Visit  Medication Sig Dispense Refill   meloxicam  (MOBIC ) 15 MG tablet One tab PO qAM with breakfast for 2 weeks, then daily prn pain. 30 tablet 3   No current facility-administered medications on file prior to visit.        ROS:  All others reviewed and negative.  Objective        PE:  BP 112/78   Pulse (!) 50   Temp 97.8 F (36.6 C) (Temporal)   Ht 5' 3 (1.6 m)   Wt 149 lb (67.6 kg)   SpO2 99%   BMI 26.39 kg/m                 Constitutional: Pt appears in NAD                HENT: Head: NCAT.                Right Ear: External ear normal.                 Left Ear: External ear normal.                Eyes: . Pupils are equal, round, and reactive to light. Conjunctivae and EOM are normal               Nose: without d/c or deformity               Neck: Neck supple. Gross normal ROM               Cardiovascular: Normal rate and regular rhythm.                 Pulmonary/Chest: Effort normal and breath sounds without rales or wheezing.                Abd:  Soft, mild tender  mid left abd, ND, + BS, no organomegaly               Neurological: Pt is alert. At baseline orientation, motor grossly intact               Skin: Skin is warm. No rashes, no other new lesions, LE edema - none               Psychiatric: Pt behavior is normal without agitation   Micro: none  Cardiac tracings I have personally interpreted today:  none  Pertinent Radiological findings (summarize): none   Lab Results  Component Value Date   WBC 4.8 03/28/2024   HGB 14.9 03/28/2024   HCT 46.2 03/28/2024   PLT 159.0 03/28/2024   GLUCOSE 77 03/28/2024   CHOL 168 09/17/2023   TRIG 109.0 09/17/2023   HDL 40.60 09/17/2023   LDLCALC 106 (H) 09/17/2023   ALT 186 (H) 03/28/2024   AST 151 (H) 03/28/2024   NA 139 03/28/2024   K 4.2 03/28/2024   CL 104 03/28/2024   CREATININE 1.15 03/28/2024   BUN 19 03/28/2024   CO2 28 03/28/2024   TSH 1.94 09/17/2023   PSA 0.85 09/17/2023   HGBA1C 5.5 09/17/2023   Assessment/Plan:  Brendan Alexander is a 73 y.o. Asian [4] male with  has a past medical history of Constipation and GERD (gastroesophageal reflux disease).  Vitamin D  deficiency Last vitamin D  Lab Results  Component Value Date   VD25OH 17.67 (L) 09/17/2023   Low, for oral replacement   Tobacco use Pt counsled to quit, pt not ready  Left sided abdominal pain Etiology unclear, for trial miralax  every day prn  Gastroesophageal reflux disease Pt requests change PPI - for change to protonix   40 qd  Elevated liver function tests Etiology unclear, declines imaging for now, for GI referral  Followup: Return in about 6 months (around 09/25/2024).  Brendan Rush, MD 03/28/2024 6:52 PM [facility]

## 2024-05-28 ENCOUNTER — Encounter: Payer: Self-pay | Admitting: Internal Medicine

## 2024-05-28 DIAGNOSIS — R7989 Other specified abnormal findings of blood chemistry: Secondary | ICD-10-CM

## 2024-05-28 DIAGNOSIS — R109 Unspecified abdominal pain: Secondary | ICD-10-CM

## 2024-06-06 ENCOUNTER — Other Ambulatory Visit: Payer: Self-pay | Admitting: Internal Medicine

## 2024-06-06 DIAGNOSIS — K219 Gastro-esophageal reflux disease without esophagitis: Secondary | ICD-10-CM

## 2024-08-25 ENCOUNTER — Ambulatory Visit: Admitting: Family Medicine
